# Patient Record
Sex: Female | Born: 1995 | Race: White | Hispanic: No | Marital: Single | Smoking: Former smoker
Health system: Southern US, Community
[De-identification: ages and names within clinical notes are randomized; demographics above are authoritative.]

## PROBLEM LIST (undated history)

## (undated) DIAGNOSIS — I10 Essential (primary) hypertension: Secondary | ICD-10-CM

## (undated) DIAGNOSIS — F319 Bipolar disorder, unspecified: Secondary | ICD-10-CM

## (undated) DIAGNOSIS — E079 Disorder of thyroid, unspecified: Secondary | ICD-10-CM

## (undated) DIAGNOSIS — E282 Polycystic ovarian syndrome: Secondary | ICD-10-CM

## (undated) DIAGNOSIS — R569 Unspecified convulsions: Secondary | ICD-10-CM

## (undated) HISTORY — PX: TONSILLECTOMY: SUR1361

## (undated) HISTORY — DX: Polycystic ovarian syndrome: E28.2

## (undated) HISTORY — PX: WISDOM TOOTH EXTRACTION: SHX21

---
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
## 2015-01-21 ENCOUNTER — Encounter: Payer: Self-pay | Admitting: Emergency Medicine

## 2015-01-21 ENCOUNTER — Emergency Department
Admission: EM | Admit: 2015-01-21 | Discharge: 2015-01-21 | Disposition: A | Discharge status: Home / Self Care | Payer: Medicaid Other | Attending: Emergency Medicine | Admitting: Emergency Medicine

## 2015-01-21 DIAGNOSIS — Y92410 Unspecified street and highway as the place of occurrence of the external cause: Secondary | ICD-10-CM | POA: Insufficient documentation

## 2015-01-21 DIAGNOSIS — S4992XA Unspecified injury of left shoulder and upper arm, initial encounter: Secondary | ICD-10-CM | POA: Insufficient documentation

## 2015-01-21 DIAGNOSIS — I10 Essential (primary) hypertension: Secondary | ICD-10-CM | POA: Diagnosis not present

## 2015-01-21 DIAGNOSIS — S0990XA Unspecified injury of head, initial encounter: Secondary | ICD-10-CM | POA: Diagnosis present

## 2015-01-21 DIAGNOSIS — S0093XA Contusion of unspecified part of head, initial encounter: Secondary | ICD-10-CM | POA: Diagnosis not present

## 2015-01-21 DIAGNOSIS — Z79899 Other long term (current) drug therapy: Secondary | ICD-10-CM | POA: Diagnosis not present

## 2015-01-21 DIAGNOSIS — T148 Other injury of unspecified body region: Secondary | ICD-10-CM | POA: Diagnosis not present

## 2015-01-21 DIAGNOSIS — Y998 Other external cause status: Secondary | ICD-10-CM | POA: Diagnosis not present

## 2015-01-21 DIAGNOSIS — Z87891 Personal history of nicotine dependence: Secondary | ICD-10-CM | POA: Insufficient documentation

## 2015-01-21 DIAGNOSIS — Y9389 Activity, other specified: Secondary | ICD-10-CM | POA: Diagnosis not present

## 2015-01-21 DIAGNOSIS — T148XXA Other injury of unspecified body region, initial encounter: Secondary | ICD-10-CM
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
## 2015-01-22 ENCOUNTER — Emergency Department
Admission: EM | Admit: 2015-01-22 | Discharge: 2015-01-22 | Disposition: A | Discharge status: Home / Self Care | Payer: Medicaid Other | Attending: Emergency Medicine | Admitting: Emergency Medicine

## 2015-01-22 ENCOUNTER — Emergency Department: Payer: Medicaid Other

## 2015-01-22 ENCOUNTER — Encounter: Payer: Self-pay | Admitting: Emergency Medicine

## 2015-01-22 DIAGNOSIS — Z79899 Other long term (current) drug therapy: Secondary | ICD-10-CM | POA: Insufficient documentation

## 2015-01-22 DIAGNOSIS — R42 Dizziness and giddiness: Secondary | ICD-10-CM | POA: Insufficient documentation

## 2015-01-22 DIAGNOSIS — I10 Essential (primary) hypertension: Secondary | ICD-10-CM | POA: Diagnosis not present

## 2015-01-22 DIAGNOSIS — Z3202 Encounter for pregnancy test, result negative: Secondary | ICD-10-CM | POA: Insufficient documentation

## 2015-01-22 DIAGNOSIS — Z87891 Personal history of nicotine dependence: Secondary | ICD-10-CM | POA: Diagnosis not present

## 2015-01-22 DIAGNOSIS — S161XXD Strain of muscle, fascia and tendon at neck level, subsequent encounter: Secondary | ICD-10-CM | POA: Insufficient documentation

## 2015-01-22 DIAGNOSIS — S40012D Contusion of left shoulder, subsequent encounter: Secondary | ICD-10-CM | POA: Insufficient documentation

## 2015-01-22 DIAGNOSIS — S199XXD Unspecified injury of neck, subsequent encounter: Secondary | ICD-10-CM | POA: Diagnosis present

## 2015-01-22 DIAGNOSIS — T148XXA Other injury of unspecified body region, initial encounter: Secondary | ICD-10-CM

## 2015-01-22 LAB — POCT PREGNANCY, URINE: Preg Test, Ur: NEGATIVE

## 2015-01-22 MED ORDER — HYDROCODONE-ACETAMINOPHEN 5-325 MG PO TABS: 1.0000 | ORAL_TABLET | ORAL | Status: DC | PRN

## 2015-01-22 MED ORDER — CYCLOBENZAPRINE HCL 10 MG PO TABS
10.0000 mg | ORAL_TABLET | Freq: Once | ORAL | Status: AC
  Administered 2015-01-22: 10 mg via ORAL
  Filled 2015-01-22: qty 1
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
## 2015-01-22 NOTE — ED Notes (Signed)
preg test neg

## 2015-01-22 NOTE — Discharge Instructions (Signed)
Contusion °A contusion is a deep bruise. Contusions are the result of an injury that caused bleeding under the skin. The contusion may turn blue, purple, or yellow. Minor injuries will give you a painless contusion, but more severe contusions may stay painful and swollen for a few weeks.  °CAUSES  °A contusion is usually caused by a blow, trauma, or direct force to an area of the body. °SYMPTOMS  °· Swelling and redness of the injured area. °· Bruising of the injured area. °· Tenderness and soreness of the injured area. °· Pain. °DIAGNOSIS  °The diagnosis can be made by taking a history and physical exam. An X-ray, CT scan, or MRI may be needed to determine if there were any associated injuries, such as fractures. °TREATMENT  °Specific treatment will depend on what area of the body was injured. In general, the best treatment for a contusion is resting, icing, elevating, and applying cold compresses to the injured area. Over-the-counter medicines may also be recommended for pain control. Ask your caregiver what the best treatment is for your contusion. °HOME CARE INSTRUCTIONS  °· Put ice on the injured area. °¨ Put ice in a plastic bag. °¨ Place a towel between your skin and the bag. °¨ Leave the ice on for 15-20 minutes, 3-4 times a day, or as directed by your health care provider. °· Only take over-the-counter or prescription medicines for pain, discomfort, or fever as directed by your caregiver. Your caregiver may recommend avoiding anti-inflammatory medicines (aspirin, ibuprofen, and naproxen) for 48 hours because these medicines may increase bruising. °· Rest the injured area. °· If possible, elevate the injured area to reduce swelling. °SEEK IMMEDIATE MEDICAL CARE IF:  °· You have increased bruising or swelling. °· You have pain that is getting worse. °· Your swelling or pain is not relieved with medicines. °MAKE SURE YOU:  °· Understand these instructions. °· Will watch your condition. °· Will get help right  away if you are not doing well or get worse. °Document Released: 02/02/2005 Document Revised: 04/30/2013 Document Reviewed: 02/28/2011 °ExitCare® Patient Information ©2015 [employer]. This information is not intended to replace advice given to you by your health care provider. Make sure you discuss any questions you have with your health care provider. ° °
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
## 2015-04-21 DIAGNOSIS — I10 Essential (primary) hypertension: Secondary | ICD-10-CM | POA: Insufficient documentation

## 2015-08-17 ENCOUNTER — Emergency Department (HOSPITAL_COMMUNITY): Payer: Medicaid Other

## 2015-08-17 ENCOUNTER — Encounter (HOSPITAL_COMMUNITY): Payer: Self-pay | Admitting: *Deleted

## 2015-08-17 ENCOUNTER — Emergency Department (HOSPITAL_COMMUNITY)
Admission: EM | Admit: 2015-08-17 | Discharge: 2015-08-17 | Disposition: A | Discharge status: Home / Self Care | Payer: Medicaid Other | Attending: Emergency Medicine | Admitting: Emergency Medicine

## 2015-08-17 DIAGNOSIS — S5011XA Contusion of right forearm, initial encounter: Secondary | ICD-10-CM | POA: Insufficient documentation

## 2015-08-17 DIAGNOSIS — M545 Low back pain: Secondary | ICD-10-CM

## 2015-08-17 DIAGNOSIS — S199XXA Unspecified injury of neck, initial encounter: Secondary | ICD-10-CM | POA: Diagnosis not present

## 2015-08-17 DIAGNOSIS — Z87891 Personal history of nicotine dependence: Secondary | ICD-10-CM | POA: Insufficient documentation

## 2015-08-17 DIAGNOSIS — Z8639 Personal history of other endocrine, nutritional and metabolic disease: Secondary | ICD-10-CM | POA: Insufficient documentation

## 2015-08-17 DIAGNOSIS — F319 Bipolar disorder, unspecified: Secondary | ICD-10-CM | POA: Diagnosis not present

## 2015-08-17 DIAGNOSIS — Y998 Other external cause status: Secondary | ICD-10-CM | POA: Insufficient documentation

## 2015-08-17 DIAGNOSIS — Y92410 Unspecified street and highway as the place of occurrence of the external cause: Secondary | ICD-10-CM | POA: Insufficient documentation

## 2015-08-17 DIAGNOSIS — Z79899 Other long term (current) drug therapy: Secondary | ICD-10-CM | POA: Diagnosis not present

## 2015-08-17 DIAGNOSIS — I10 Essential (primary) hypertension: Secondary | ICD-10-CM | POA: Insufficient documentation

## 2015-08-17 DIAGNOSIS — S20212A Contusion of left front wall of thorax, initial encounter: Secondary | ICD-10-CM

## 2015-08-17 DIAGNOSIS — Y9389 Activity, other specified: Secondary | ICD-10-CM | POA: Insufficient documentation

## 2015-08-17 DIAGNOSIS — S3992XA Unspecified injury of lower back, initial encounter: Secondary | ICD-10-CM | POA: Diagnosis not present

## 2015-08-17 DIAGNOSIS — S59911A Unspecified injury of right forearm, initial encounter: Secondary | ICD-10-CM | POA: Diagnosis present

## 2015-08-17 MED ORDER — METHOCARBAMOL 500 MG PO TABS: 500.0000 mg | ORAL_TABLET | Freq: Two times a day (BID) | ORAL | Status: DC

## 2015-08-17 MED ORDER — IBUPROFEN 600 MG PO TABS: 600.0000 mg | ORAL_TABLET | Freq: Four times a day (QID) | ORAL | Status: DC | PRN

## 2015-08-17 MED ORDER — IBUPROFEN 400 MG PO TABS
800.0000 mg | ORAL_TABLET | Freq: Once | ORAL | Status: AC
  Administered 2015-08-17: 800 mg via ORAL
  Filled 2015-08-17: qty 2
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
## 2015-08-17 NOTE — ED Notes (Signed)
Patient able to ambulate independently  
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
## 2016-05-13 ENCOUNTER — Emergency Department: Payer: Medicaid Other

## 2016-05-13 ENCOUNTER — Observation Stay: Payer: Medicaid Other

## 2016-05-13 ENCOUNTER — Observation Stay
Admission: EM | Admit: 2016-05-13 | Discharge: 2016-05-14 | Disposition: A | Discharge status: Home / Self Care | Payer: Medicaid Other | Attending: Internal Medicine | Admitting: Internal Medicine

## 2016-05-13 DIAGNOSIS — R112 Nausea with vomiting, unspecified: Secondary | ICD-10-CM | POA: Insufficient documentation

## 2016-05-13 DIAGNOSIS — R55 Syncope and collapse: Secondary | ICD-10-CM | POA: Diagnosis not present

## 2016-05-13 DIAGNOSIS — J189 Pneumonia, unspecified organism: Secondary | ICD-10-CM

## 2016-05-13 DIAGNOSIS — F17210 Nicotine dependence, cigarettes, uncomplicated: Secondary | ICD-10-CM | POA: Diagnosis not present

## 2016-05-13 DIAGNOSIS — I10 Essential (primary) hypertension: Secondary | ICD-10-CM | POA: Diagnosis not present

## 2016-05-13 DIAGNOSIS — G40109 Localization-related (focal) (partial) symptomatic epilepsy and epileptic syndromes with simple partial seizures, not intractable, without status epilepticus: Secondary | ICD-10-CM | POA: Insufficient documentation

## 2016-05-13 DIAGNOSIS — F319 Bipolar disorder, unspecified: Secondary | ICD-10-CM | POA: Insufficient documentation

## 2016-05-13 DIAGNOSIS — T426X1A Poisoning by other antiepileptic and sedative-hypnotic drugs, accidental (unintentional), initial encounter: Secondary | ICD-10-CM | POA: Diagnosis present

## 2016-05-13 DIAGNOSIS — R197 Diarrhea, unspecified: Secondary | ICD-10-CM

## 2016-05-13 DIAGNOSIS — Z888 Allergy status to other drugs, medicaments and biological substances status: Secondary | ICD-10-CM | POA: Insufficient documentation

## 2016-05-13 DIAGNOSIS — E860 Dehydration: Secondary | ICD-10-CM | POA: Diagnosis not present

## 2016-05-13 DIAGNOSIS — E039 Hypothyroidism, unspecified: Secondary | ICD-10-CM | POA: Insufficient documentation

## 2016-05-13 DIAGNOSIS — T426X5A Adverse effect of other antiepileptic and sedative-hypnotic drugs, initial encounter: Secondary | ICD-10-CM | POA: Diagnosis not present

## 2016-05-13 LAB — LIPASE, BLOOD: LIPASE: 32 U/L (ref 11–51)

## 2016-05-13 LAB — CBC
HEMATOCRIT: 46.7 % (ref 35.0–47.0)
HEMOGLOBIN: 16.2 g/dL — AB (ref 12.0–16.0)
MCH: 31.3 pg (ref 26.0–34.0)
MCHC: 34.8 g/dL (ref 32.0–36.0)
MCV: 89.9 fL (ref 80.0–100.0)
Platelets: 333 10*3/uL (ref 150–440)
RBC: 5.2 MIL/uL (ref 3.80–5.20)
RDW: 12.8 % (ref 11.5–14.5)
WBC: 16.1 10*3/uL — ABNORMAL HIGH (ref 3.6–11.0)

## 2016-05-13 LAB — URINE DRUG SCREEN, QUALITATIVE (ARMC ONLY)
AMPHETAMINES, UR SCREEN: NOT DETECTED
BENZODIAZEPINE, UR SCRN: NOT DETECTED
Barbiturates, Ur Screen: NOT DETECTED
Cannabinoid 50 Ng, Ur ~~LOC~~: NOT DETECTED
Cocaine Metabolite,Ur ~~LOC~~: NOT DETECTED
MDMA (Ecstasy)Ur Screen: NOT DETECTED
METHADONE SCREEN, URINE: NOT DETECTED
Opiate, Ur Screen: POSITIVE — AB
Phencyclidine (PCP) Ur S: NOT DETECTED
Tricyclic, Ur Screen: NOT DETECTED

## 2016-05-13 LAB — ETHANOL

## 2016-05-13 LAB — VALPROIC ACID LEVEL

## 2016-05-13 LAB — COMPREHENSIVE METABOLIC PANEL
ALK PHOS: 65 U/L (ref 38–126)
ALT: 23 U/L (ref 14–54)
AST: 34 U/L (ref 15–41)
Albumin: 4.3 g/dL (ref 3.5–5.0)
Anion gap: 11 (ref 5–15)
BILIRUBIN TOTAL: 0.4 mg/dL (ref 0.3–1.2)
BUN: 10 mg/dL (ref 6–20)
CALCIUM: 9.2 mg/dL (ref 8.9–10.3)
CO2: 21 mmol/L — ABNORMAL LOW (ref 22–32)
Chloride: 110 mmol/L (ref 101–111)
Creatinine, Ser: 0.95 mg/dL (ref 0.44–1.00)
GFR calc Af Amer: >60 mL/min (ref >60)
GLUCOSE: 109 mg/dL — AB (ref 65–99)
POTASSIUM: 3.6 mmol/L (ref 3.5–5.1)
Sodium: 142 mmol/L (ref 135–145)
TOTAL PROTEIN: 7.1 g/dL (ref 6.5–8.1)

## 2016-05-13 LAB — URINALYSIS, COMPLETE (UACMP) WITH MICROSCOPIC
BILIRUBIN URINE: NEGATIVE
Glucose, UA: NEGATIVE mg/dL
Hgb urine dipstick: NEGATIVE
Ketones, ur: 5 mg/dL — AB
LEUKOCYTES UA: NEGATIVE
Nitrite: NEGATIVE
Protein, ur: NEGATIVE mg/dL
SPECIFIC GRAVITY, URINE: 1.01 (ref 1.005–1.030)
pH: 6 (ref 5.0–8.0)

## 2016-05-13 LAB — POCT PREGNANCY, URINE: Preg Test, Ur: NEGATIVE

## 2016-05-13 LAB — AMMONIA: AMMONIA: 31 umol/L (ref 9–35)

## 2016-05-13 MED ORDER — ACETAMINOPHEN 650 MG RE SUPP: 650.0000 mg | Freq: Four times a day (QID) | RECTAL | Status: DC | PRN

## 2016-05-13 MED ORDER — LISINOPRIL 10 MG PO TABS
10.0000 mg | ORAL_TABLET | Freq: Every day | ORAL | Status: DC
  Administered 2016-05-13 – 2016-05-14 (×2): 10 mg via ORAL
  Filled 2016-05-13 (×2): qty 1

## 2016-05-13 MED ORDER — SODIUM CHLORIDE 0.9 % IV SOLN
INTRAVENOUS | Status: DC
  Administered 2016-05-13 (×2): via INTRAVENOUS
  Administered 2016-05-13: 09:00:00 500 mL via INTRAVENOUS
  Administered 2016-05-14: 05:00:00 via INTRAVENOUS

## 2016-05-13 MED ORDER — ACETAMINOPHEN 325 MG PO TABS: 650.0000 mg | ORAL_TABLET | Freq: Four times a day (QID) | ORAL | Status: DC | PRN

## 2016-05-13 MED ORDER — ONDANSETRON 8 MG PO TBDP
ORAL_TABLET | ORAL | Status: AC
  Administered 2016-05-13: 8 mg
  Filled 2016-05-13: qty 1

## 2016-05-13 MED ORDER — SERTRALINE HCL 50 MG PO TABS
25.0000 mg | ORAL_TABLET | Freq: Every day | ORAL | Status: DC
  Administered 2016-05-13 – 2016-05-14 (×2): 25 mg via ORAL
  Filled 2016-05-13 (×2): qty 1

## 2016-05-13 MED ORDER — SODIUM CHLORIDE 0.9 % IV BOLUS (SEPSIS)
1000.0000 mL | INTRAVENOUS | Status: AC
  Administered 2016-05-13: 1000 mL via INTRAVENOUS

## 2016-05-13 MED ORDER — TRAZODONE HCL 50 MG PO TABS: 150.0000 mg | ORAL_TABLET | Freq: Every day | ORAL | Status: DC

## 2016-05-13 MED ORDER — KETOROLAC TROMETHAMINE 30 MG/ML IJ SOLN
30.0000 mg | Freq: Three times a day (TID) | INTRAMUSCULAR | Status: DC | PRN
  Administered 2016-05-13: 30 mg via INTRAVENOUS
  Filled 2016-05-13: qty 1

## 2016-05-13 MED ORDER — ONDANSETRON HCL 4 MG/2ML IJ SOLN: 4.0000 mg | Freq: Four times a day (QID) | INTRAMUSCULAR | Status: DC | PRN

## 2016-05-13 MED ORDER — SENNOSIDES-DOCUSATE SODIUM 8.6-50 MG PO TABS: 1.0000 | ORAL_TABLET | Freq: Every evening | ORAL | Status: DC | PRN

## 2016-05-13 MED ORDER — ONDANSETRON HCL 4 MG PO TABS
4.0000 mg | ORAL_TABLET | Freq: Four times a day (QID) | ORAL | Status: DC | PRN
  Administered 2016-05-13: 4 mg via ORAL
  Filled 2016-05-13: qty 1

## 2016-05-13 MED ORDER — IBUPROFEN 600 MG PO TABS: 600.0000 mg | ORAL_TABLET | Freq: Four times a day (QID) | ORAL | Status: DC | PRN

## 2016-05-13 MED ORDER — LAMOTRIGINE 100 MG PO TABS
100.0000 mg | ORAL_TABLET | Freq: Every day | ORAL | Status: DC
  Administered 2016-05-13: 23:00:00 100 mg via ORAL
  Filled 2016-05-13 (×2): qty 1

## 2016-05-13 NOTE — ED Notes (Signed)
Pt unable to urinate at this time. Pt states she feels very dry. Aware we need urine sample.Cup of water given to drink.

## 2016-05-13 NOTE — ED Provider Notes (Signed)
[facility] Emergency Department Provider Note  ____________________________________________   None    (approximate)  I have reviewed the triage vital signs and the nursing notes.   HISTORY  Chief Complaint Loss of Consciousness    HPI Alexandra Solis is a 21 y.o. female His past medical history includes seizure disorder and she takes Lamictal and Depakote.  She presents for evaluation of general malaise and recurrent episodes of vomiting, diarrhea, and intermittent abdominal pain.  She also states that she has had 6 episodes today of passing out.  She feels weak all over all she has no focal numbness, weakness, or tingling in history of her extremities.  She does not have a headache and has had no visual changes.  She denies changing her medication regimen or adding any new medications recently.  She denies chest pain and shortness of breath.  She describes her symptoms overall as severe.  She had a large episode of emesis while awaiting in exam room.   Past Medical History:  Diagnosis Date  . Bipolar 1 disorder (HCC)   . Bipolar 1 disorder (HCC)   . Hypertension   . Seizures (HCC)   . Thyroid disease    hypo    There are no active problems to display for this patient.   Past Surgical History:  Procedure Laterality Date  . TONSILLECTOMY      Prior to Admission medications   Medication Sig Start Date End Date Taking? Authorizing Provider  HYDROcodone-acetaminophen (NORCO/VICODIN) 5-325 MG per tablet Take 1 tablet by mouth every 4 (four) hours as needed. 01/22/15   Chinita Pesterari B Triplett, FNP  ibuprofen (ADVIL,MOTRIN) 600 MG tablet Take 1 tablet (600 mg total) by mouth every 6 (six) hours as needed. 08/17/15   Elson AreasLeslie K Sofia, PA-C  lamoTRIgine (LAMICTAL) 100 MG tablet Take 100 mg by mouth at bedtime.    Historical Provider, MD  lisinopril (PRINIVIL,ZESTRIL) 10 MG tablet Take 10 mg by mouth daily.    Historical Provider, MD  LORazepam (ATIVAN) 0.5 MG  tablet Take 0.5 mg by mouth every 8 (eight) hours.    Historical Provider, MD  methocarbamol (ROBAXIN) 500 MG tablet Take 1 tablet (500 mg total) by mouth 2 (two) times daily. 08/17/15   Elson AreasLeslie K Sofia, PA-C  sertraline (ZOLOFT) 25 MG tablet Take 25 mg by mouth daily with breakfast.     Historical Provider, MD  traZODone (DESYREL) 150 MG tablet Take 150 mg by mouth at bedtime.    Historical Provider, MD  zolpidem (AMBIEN) 10 MG tablet Take 10 mg by mouth at bedtime as needed for sleep.    Historical Provider, MD    Allergies Seroquel [quetiapine fumarate]  No family history on file.  Social History Social History  Substance Use Topics  . Smoking status: Former Games developermoker  . Smokeless tobacco: Not on file  . Alcohol use Yes    Review of Systems Constitutional: No fever/chills.  General malaise, generalized weakness. Eyes: No visual changes. ENT: No sore throat. Cardiovascular: Denies chest pain.  Syncope x 6 episodes today Respiratory: Denies shortness of breath. Gastrointestinal: N/V/D x 12+ hours.  Intermittent abd pain, now resolved. Genitourinary: Negative for dysuria. Musculoskeletal: Negative for back pain. Skin: Negative for rash. Neurological: Negative for headaches, focal weakness or numbness.  10-point ROS otherwise negative.  ____________________________________________   PHYSICAL EXAM:  VITAL SIGNS: ED Triage Vitals  Enc Vitals Group     BP 05/13/16 0011 132/75     Pulse Rate 05/13/16  0011 (!) 117     Resp 05/13/16 0011 20     Temp 05/13/16 0011 98.1 F (36.7 C)     Temp Source 05/13/16 0011 Oral     SpO2 05/13/16 0011 99 %     Weight 05/13/16 0012 230 lb (104.3 kg)     Height 05/13/16 0012 5\' 11"  (1.803 m)     Head Circumference --      Peak Flow --      Pain Score 05/13/16 0014 10     Pain Loc --      Pain Edu? --      Excl. in GC? --     Constitutional: Alert and oriented. Well appearing and in no acute distress. Eyes: Conjunctivae are normal.  PERRL. EOMI. Head: Atraumatic. Nose: No congestion/rhinnorhea. Mouth/Throat: Mucous membranes are dry, lips are cracked.  Oropharynx non-erythematous. Neck: No stridor.  No meningeal signs.   Cardiovascular: Normal rate, regular rhythm. Good peripheral circulation. Grossly normal heart sounds. Respiratory: Normal respiratory effort.  No retractions. Lungs CTAB. Gastrointestinal: Soft and nontender. No distention.  Musculoskeletal: No lower extremity tenderness nor edema. No gross deformities of extremities. Neurologic:  Normal speech and language. No gross focal neurologic deficits are appreciated.  Skin:  Skin is warm, dry and intact. No rash noted. Psychiatric: Mood and affect are normal. Speech and behavior are normal.  ____________________________________________   LABS (all labs ordered are listed, but only abnormal results are displayed)  Labs Reviewed  CBC - Abnormal; Notable for the following:       Result Value   WBC 16.1 (*)    Hemoglobin 16.2 (*)    All other components within normal limits  COMPREHENSIVE METABOLIC PANEL - Abnormal; Notable for the following:    CO2 21 (*)    Glucose, Bld 109 (*)    All other components within normal limits  VALPROIC ACID LEVEL - Abnormal; Notable for the following:    Valproic Acid Lvl >150 (*)    All other components within normal limits  LIPASE, BLOOD  AMMONIA  ETHANOL  URINALYSIS, COMPLETE (UACMP) WITH MICROSCOPIC  URINE DRUG SCREEN, QUALITATIVE ([facility] ONLY)  POC URINE PREG, ED   ____________________________________________  EKG  ED ECG REPORT I, Mont Jagoda, the attending physician, personally viewed and interpreted this ECG.  Date: 05/13/2016 EKG Time: 6:15 AM Rate: 98 Rhythm: normal sinus rhythm QRS Axis: normal Intervals: normal, QTC 484 ms ST/T Wave abnormalities: normal Conduction Disturbances: none Narrative Interpretation: unremarkable  ____________________________________________  RADIOLOGY   Ct  Head Wo Contrast  Result Date: 05/13/2016 CLINICAL DATA:  Syncopal episode tonight. EXAM: CT HEAD WITHOUT CONTRAST TECHNIQUE: Contiguous axial images were obtained from the base of the skull through the vertex without intravenous contrast. COMPARISON:  01/22/2015 FINDINGS: Brain: There is no intracranial hemorrhage, mass or evidence of acute infarction. There is no extra-axial fluid collection. Gray matter and white matter appear normal. Cerebral volume is normal for age. Brainstem and posterior fossa are unremarkable. The CSF spaces appear normal. Prominent perivascular space in the inferior right basal ganglia, unchanged. Vascular: No hyperdense vessel or unexpected calcification. Skull: Normal. Negative for fracture or focal lesion. Sinuses/Orbits: No acute finding. Other: None. IMPRESSION: Normal brain Electronically Signed   By: Ellery Plunk M.D.   On: 05/13/2016 05:45    ____________________________________________   PROCEDURES  Procedure(s) performed:   Procedures   Critical Care performed: No ____________________________________________   INITIAL IMPRESSION / ASSESSMENT AND PLAN / ED COURSE  Pertinent labs & imaging results  that were available during my care of the patient were reviewed by me and considered in my medical decision making (see chart for details).   Clinical Course as of May 13 621  Caleen Essex May 13, 2016  4098 We brought the patient back as soon as the lab informed us of the critical valproic acid level.  Ordering ammonia level and CT head non-con (to eval for possible cerebral edema).  Giving 1L NS, adding on urine drug screen.  Anticipate admission for symptom management and observation while monitoring depakote levels.  [CF]  0612 Normal ammonia and normal head CT which is reassuring.  Will admit to hospitalist for further management of valproic acid overdose w/ N/V/D and syncope. Ammonia: 31 [CF]    Clinical Course User Index [CF] Loleta Rose, MD     ____________________________________________  FINAL CLINICAL IMPRESSION(S) / ED DIAGNOSES  Final diagnoses:  Valproic acid toxicity, accidental or unintentional, initial encounter  Nausea vomiting and diarrhea  Syncope, unspecified syncope type     MEDICATIONS GIVEN DURING THIS VISIT:  Medications  ondansetron (ZOFRAN-ODT) 8 MG disintegrating tablet (8 mg  Given 05/13/16 0228)  sodium chloride 0.9 % bolus 1,000 mL (1,000 mLs Intravenous New Bag/Given 05/13/16 0417)     NEW OUTPATIENT MEDICATIONS STARTED DURING THIS VISIT:  New Prescriptions   No medications on file    Modified Medications   No medications on file    Discontinued Medications   No medications on file     Note:  This document was prepared using Dragon voice recognition software and may include unintentional dictation errors.    Loleta Rose, MD 05/13/16 [phone]

## 2016-05-13 NOTE — ED Notes (Signed)
Patient transported to CT 

## 2016-05-13 NOTE — H&P (Signed)
[facility] [place] at [facility]   PATIENT NAME: Alexandra Solis    MR#:  [id number]  DATE OF BIRTH:  07/13/1995  DATE OF ADMISSION:  05/13/2016  PRIMARY CARE PHYSICIAN: Pcp Not In System   REQUESTING/REFERRING PHYSICIAN:   CHIEF COMPLAINT:   Chief Complaint  Patient presents with  . Loss of Consciousness    HISTORY OF PRESENT ILLNESS: Alexandra Solis  is a 21 y.o. female with a known history of Bipolar disorder, hypertension, seizure disorder presented to the emergency room for nausea or vomiting and weakness. This has been going on for the last couple of days. Patient also has multiple episodes of passing out since one day. She has been recently started on Depakote for seizure disorder. The levels of Depakote were checked in the emergency room and were elevated. No complaints of any chest pain, shortness of breath. Patient is awake and alert and responds to all verbal commands in the emergency room. Complaints of dizziness and generalized weakness. Patient also has not been able to drink any fluids because of the vomiting. She appears dry and dehydrated.  PAST MEDICAL HISTORY:   Past Medical History:  Diagnosis Date  . Bipolar 1 disorder (HCC)   . Bipolar 1 disorder (HCC)   . Hypertension   . Seizures (HCC)   . Thyroid disease    hypo    PAST SURGICAL HISTORY: Past Surgical History:  Procedure Laterality Date  . TONSILLECTOMY      SOCIAL HISTORY:  Social History  Substance Use Topics  . Smoking status: Current Some Day Smoker    Types: Cigarettes  . Smokeless tobacco: Never Used  . Alcohol use Yes    FAMILY HISTORY: No family history on file.  DRUG ALLERGIES:  Allergies  Allergen Reactions  . Seroquel [Quetiapine Fumarate] Other (See Comments)    Behavorial changes    REVIEW OF SYSTEMS:   CONSTITUTIONAL: No fever, has weakness.  EYES: No blurred or double vision.  EARS, NOSE, AND THROAT: No tinnitus or ear pain.  RESPIRATORY: No  cough, shortness of breath, wheezing or hemoptysis.  CARDIOVASCULAR: No chest pain, orthopnea, edema.  GASTROINTESTINAL: Has nausea, vomiting,  No diarrhea or abdominal pain.  GENITOURINARY: No dysuria, hematuria.  ENDOCRINE: No polyuria, nocturia,  HEMATOLOGY: No anemia, easy bruising or bleeding SKIN: No rash or lesion. MUSCULOSKELETAL: No joint pain or arthritis.   NEUROLOGIC: No tingling, numbness, weakness.  Passed out multiple times PSYCHIATRY: No anxiety or depression.   MEDICATIONS AT HOME:  Prior to Admission medications   Medication Sig Start Date End Date Taking? Authorizing Provider  HYDROcodone-acetaminophen (NORCO/VICODIN) 5-325 MG per tablet Take 1 tablet by mouth every 4 (four) hours as needed. 01/22/15   Chinita Pester, FNP  ibuprofen (ADVIL,MOTRIN) 600 MG tablet Take 1 tablet (600 mg total) by mouth every 6 (six) hours as needed. 08/17/15   Elson Areas, PA-C  lamoTRIgine (LAMICTAL) 100 MG tablet Take 100 mg by mouth at bedtime.    Historical Provider, MD  lisinopril (PRINIVIL,ZESTRIL) 10 MG tablet Take 10 mg by mouth daily.    Historical Provider, MD  LORazepam (ATIVAN) 0.5 MG tablet Take 0.5 mg by mouth every 8 (eight) hours.    Historical Provider, MD  methocarbamol (ROBAXIN) 500 MG tablet Take 1 tablet (500 mg total) by mouth 2 (two) times daily. 08/17/15   Elson Areas, PA-C  sertraline (ZOLOFT) 25 MG tablet Take 25 mg by mouth daily with breakfast.     Historical  Provider, MD  traZODone (DESYREL) 150 MG tablet Take 150 mg by mouth at bedtime.    Historical Provider, MD  zolpidem (AMBIEN) 10 MG tablet Take 10 mg by mouth at bedtime as needed for sleep.    Historical Provider, MD      PHYSICAL EXAMINATION:   VITAL SIGNS: Blood pressure 95/74, pulse 89, temperature 98.1 F (36.7 C), temperature source Oral, resp. rate 17, height 5\' 11"  (1.803 m), weight 104.3 kg (230 lb), last menstrual period 04/12/2016, SpO2 99 %.  GENERAL:  21 y.o.-year-old patient lying in  the bed with no acute distress.  EYES: Pupils equal, round, reactive to light and accommodation. No scleral icterus. Extraocular muscles intact.  HEENT: Head atraumatic, normocephalic. Oropharynx dry and nasopharynx clear.  NECK:  Supple, no jugular venous distention. No thyroid enlargement, no tenderness.  LUNGS: Normal breath sounds bilaterally, no wheezing, rales,rhonchi or crepitation. No use of accessory muscles of respiration.  CARDIOVASCULAR: S1, S2 normal. No murmurs, rubs, or gallops.  ABDOMEN: Soft, nontender, nondistended. Bowel sounds present. No organomegaly or mass.  EXTREMITIES: No pedal edema, cyanosis, or clubbing.  NEUROLOGIC: Cranial nerves II through XII are intact. Muscle strength 5/5 in all extremities. Sensation intact. Gait not checked.  PSYCHIATRIC: The patient is alert and oriented x 3.  SKIN: No obvious rash, lesion, or ulcer.   LABORATORY PANEL:   CBC  Recent Labs Lab 05/13/16 0017  WBC 16.1*  HGB 16.2*  HCT 46.7  PLT 333  MCV 89.9  MCH 31.3  MCHC 34.8  RDW 12.8   ------------------------------------------------------------------------------------------------------------------  Chemistries   Recent Labs Lab 05/13/16 0017  NA 142  K 3.6  CL 110  CO2 21*  GLUCOSE 109*  BUN 10  CREATININE 0.95  CALCIUM 9.2  AST 34  ALT 23  ALKPHOS 65  BILITOT 0.4   ------------------------------------------------------------------------------------------------------------------ estimated creatinine clearance is 125.6 mL/min (by C-G formula based on SCr of 0.95 mg/dL). ------------------------------------------------------------------------------------------------------------------ No results for input(s): TSH, T4TOTAL, T3FREE, THYROIDAB in the last 72 hours.  Invalid input(s): FREET3   Coagulation profile No results for input(s): INR, PROTIME in the last 168  hours. ------------------------------------------------------------------------------------------------------------------- No results for input(s): DDIMER in the last 72 hours. -------------------------------------------------------------------------------------------------------------------  Cardiac Enzymes No results for input(s): CKMB, TROPONINI, MYOGLOBIN in the last 168 hours.  Invalid input(s): CK ------------------------------------------------------------------------------------------------------------------ Invalid input(s): POCBNP  ---------------------------------------------------------------------------------------------------------------  Urinalysis    Component Value Date/Time   COLORURINE YELLOW 05/18/2014 1441   APPEARANCEUR CLEAR 05/18/2014 1441   APPEARANCEUR Hazy 05/13/2014 2232   LABSPEC 1.006 05/18/2014 1441   LABSPEC 1.025 05/13/2014 2232   PHURINE 6.5 05/18/2014 1441   GLUCOSEU NEGATIVE 05/18/2014 1441   GLUCOSEU Negative 05/13/2014 2232   HGBUR TRACE (A) 05/18/2014 1441   BILIRUBINUR NEGATIVE 05/18/2014 1441   BILIRUBINUR 2+ 05/13/2014 2232   KETONESUR NEGATIVE 05/18/2014 1441   PROTEINUR NEGATIVE 05/18/2014 1441   UROBILINOGEN 0.2 05/18/2014 1441   NITRITE NEGATIVE 05/18/2014 1441   LEUKOCYTESUR NEGATIVE 05/18/2014 1441   LEUKOCYTESUR Negative 05/13/2014 2232     RADIOLOGY: Ct Head Wo Contrast  Result Date: 05/13/2016 CLINICAL DATA:  Syncopal episode tonight. EXAM: CT HEAD WITHOUT CONTRAST TECHNIQUE: Contiguous axial images were obtained from the base of the skull through the vertex without intravenous contrast. COMPARISON:  01/22/2015 FINDINGS: Brain: There is no intracranial hemorrhage, mass or evidence of acute infarction. There is no extra-axial fluid collection. Gray matter and white matter appear normal. Cerebral volume is normal for age. Brainstem and posterior fossa are unremarkable. The CSF spaces appear  normal. Prominent perivascular space in  the inferior right basal ganglia, unchanged. Vascular: No hyperdense vessel or unexpected calcification. Skull: Normal. Negative for fracture or focal lesion. Sinuses/Orbits: No acute finding. Other: None. IMPRESSION: Normal brain Electronically Signed   By: Ellery Plunkaniel R Mitchell M.D.   On: 05/13/2016 05:45    EKG: Orders placed or performed during the hospital encounter of 05/13/16  . ED EKG  . ED EKG  . EKG 12-Lead  . EKG 12-Lead  . EKG 12-Lead  . EKG 12-Lead    IMPRESSION AND PLAN: 21 year old female patient with history of bipolar disorder, seizure disorder presented to the emergency room with nausea, vomiting and passing out multiple times. Admitting diagnosis 1. Dehydration 2. Intractable nausea vomiting 3. Depakote toxicity 4. Seizure disorder Treatment plan Admit patient to medical floor observation bed IV fluid hydration Hold Depakote for now Recheck Depakote level in the morning Antiemetics for nausea and vomiting Check chest x-ray to rule out any pneumonia Leukocytosis could be secondary to dehydration Follow-up WBC count Supportive care. All the records are reviewed and case discussed with ED provider. Management plans discussed with the patient, family and they are in agreement.  CODE STATUS:FULL CODE Code Status History    Date Active Date Inactive Code Status Order ID Comments User Context   10/24/2014  8:30 PM 10/25/2014  2:54 AM Full Code [id number]  Toy CookeyMegan Docherty, MD ED       TOTAL TIME TAKING CARE OF THIS PATIENT: 50 minutes.    Ihor AustinPavan Pyreddy M.D on 05/13/2016 at 6:42 AM  Between 7am to 6pm - Pager - [phone]  After 6pm go to [email or web address] - password [email or web address]  [facility] [place] [facility]  Office  [phone]  CC: Primary care physician; Pcp Not In System

## 2016-05-13 NOTE — ED Notes (Signed)
Patient states she is still unable to void, she said there is "no way I am pregnant".. I told MD and CT and she will sign waiver and proceed with CT when they are able to get her.

## 2016-05-13 NOTE — ED Triage Notes (Addendum)
Pt in with co dizziness and syncope today. States has had 6 syncopal episodes states is on lamictal for seizures. Denies any recent illness, last seizure was a month ago. Pt here for right sided abd pain n.v.d since today.
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
## 2016-05-13 NOTE — ED Notes (Signed)
Pt vomited large amt in waiting room, med given per protocol.

## 2016-05-13 NOTE — Progress Notes (Signed)
Nutrition Brief Note  Patient identified on the Malnutrition Screening Tool (MST) Report  Wt Readings from Last 15 Encounters:  05/13/16 230 lb (104.3 kg)  01/22/15 220 lb (99.8 kg) (99 %, Z= 2.20)*  01/21/15 220 lb (99.8 kg) (99 %, Z= 2.20)*  10/14/14 229 lb (103.9 kg) (99 %, Z= 2.29)*  05/04/14 220 lb (99.8 kg) (99 %, Z= 2.20)*   * Growth percentiles are based on CDC 2-20 Years data.    Body mass index is 32.08 kg/m. Patient meets criteria for Obesity Unspecified based on current BMI.  Weight appears stable over the last several years, no significant wt loss  Current diet order is Regular, Labs and medications reviewed.   No nutrition interventions warranted at this time. If nutrition issues arise, please consult RD.   Romelle Starcherate Alexus Galka MS, RD, LDN [phone] Pager  [phone] Weekend/On-Call Pager

## 2016-05-13 NOTE — ED Notes (Signed)
Told patient we need urine and put a hat in the toilet, she says she is unable to go at this time.  I will attempt again after the IV fluids have had a chance to work.

## 2016-05-14 LAB — BASIC METABOLIC PANEL
ANION GAP: 3 — AB (ref 5–15)
BUN: 9 mg/dL (ref 6–20)
CALCIUM: 7.8 mg/dL — AB (ref 8.9–10.3)
CO2: 25 mmol/L (ref 22–32)
CREATININE: 1.05 mg/dL — AB (ref 0.44–1.00)
Chloride: 113 mmol/L — ABNORMAL HIGH (ref 101–111)
GFR calc Af Amer: >60 mL/min (ref >60)
GLUCOSE: 81 mg/dL (ref 65–99)
Potassium: 3.9 mmol/L (ref 3.5–5.1)
Sodium: 141 mmol/L (ref 135–145)

## 2016-05-14 LAB — VALPROIC ACID LEVEL: Valproic Acid Lvl: 19 ug/mL — ABNORMAL LOW (ref 50.0–100.0)

## 2016-05-14 MED ORDER — LEVETIRACETAM 500 MG PO TABS
500.0000 mg | ORAL_TABLET | Freq: Two times a day (BID) | ORAL | Status: DC
  Administered 2016-05-14: 500 mg via ORAL
  Filled 2016-05-14 (×2): qty 1

## 2016-05-14 MED ORDER — LEVETIRACETAM 500 MG PO TABS: 500.0000 mg | ORAL_TABLET | Freq: Two times a day (BID) | ORAL | 0 refills | Status: DC

## 2016-05-14 NOTE — Discharge Instructions (Addendum)
Depakote Poisoning.  Stop depakote. With history of seizures, no drinking alcohol, no drugs other than what is prescribed. Follow up with Dr Malvin JohnsPotter as outpatient.  Start Keprra 500mg  po twice a day

## 2016-05-14 NOTE — Discharge Summary (Signed)
[facility] - [place] at [facility]   PATIENT NAME: Alexandra Solis    MR#:  [id number]  DATE OF BIRTH:  Nov 24, 1995  DATE OF ADMISSION:  05/13/2016 ADMITTING PHYSICIAN: Houston SirenVivek J Sainani, MD  DATE OF DISCHARGE: 05/14/2016 10:18 AM  PRIMARY CARE PHYSICIAN: Dr Evelene CroonMeindert Niemeyer   ADMISSION DIAGNOSIS:  Pneumonia [J18.9] Nausea vomiting and diarrhea [R11.2, R19.7] Valproic acid toxicity, accidental or unintentional, initial encounter [T42.6X1A] Syncope, unspecified syncope type [R55]  DISCHARGE DIAGNOSIS:  Active Problems:   Valproic acid toxicity   SECONDARY DIAGNOSIS:   Past Medical History:  Diagnosis Date  . Bipolar 1 disorder (HCC)   . Bipolar 1 disorder (HCC)   . Hypertension   . Seizures (HCC)   . Thyroid disease    hypo    HOSPITAL COURSE:   1. Depakote toxicity. Patient was admitted with syncope, nausea vomiting and weakness. She was found to have a high Depakote level was given IV fluid hydration. The patient states that she started on Depakote for temporal lobe focal seizures. She does not want to go back on this medication. She states that a friend may have port her bottle of Depakote in her drink. No thoughts of hurting herself or other people. Case discussed with Dr. Loretha BrasilZeylikman neurology and he recommended starting Keppra 500 mg twice a day and follow-up with Dr. Malvin JohnsPotter as outpatient. 2. Bipolar disorder. No suicidal or homicidal ideation. Continue Zyprexa 3. History of temporal lobe focal seizures. Start Keppra and continue Lamictal. Follow-up with Dr. Malvin JohnsPotter neurology. Patient was advised to take medications as prescribed and to keep her medications away from other people. She was advised not to drink alcohol and not to use any other drugs besides ones that are prescribed. 4. History of hypertension stop lisinopril  DISCHARGE CONDITIONS:   Satisfactory  CONSULTS OBTAINED:  Treatment Team:  Pauletta BrownsYuriy Zeylikman, MD  DRUG ALLERGIES:   Allergies   Allergen Reactions  . Seroquel [Quetiapine Fumarate] Other (See Comments)    Behavorial changes    DISCHARGE MEDICATIONS:   Discharge Medication List as of 05/14/2016  9:37 AM    START taking these medications   Details  levETIRAcetam (KEPPRA) 500 MG tablet Take 1 tablet (500 mg total) by mouth 2 (two) times daily., Starting Sat 05/14/2016, Print      CONTINUE these medications which have NOT CHANGED   Details  lamoTRIgine (LAMICTAL) 100 MG tablet Take 100 mg by mouth at bedtime., Until Discontinued, Historical Med    OLANZapine (ZYPREXA) 20 MG tablet Take 20 mg by mouth daily., Historical Med      STOP taking these medications     ibuprofen (ADVIL,MOTRIN) 600 MG tablet      methocarbamol (ROBAXIN) 500 MG tablet      lisinopril (PRINIVIL,ZESTRIL) 10 MG tablet      sertraline (ZOLOFT) 25 MG tablet      traZODone (DESYREL) 150 MG tablet          DISCHARGE INSTRUCTIONS:   Follow-up with PMD one week Follow-up with Dr. Malvin JohnsPotter in 2 weeks  If you experience worsening of your admission symptoms, develop shortness of breath, life threatening emergency, suicidal or homicidal thoughts you must seek medical attention immediately by calling 911 or calling your MD immediately  if symptoms less severe.  You Must read complete instructions/literature along with all the possible adverse reactions/side effects for all the Medicines you take and that have been prescribed to you. Take any new Medicines after you have completely understood and accept all  the possible adverse reactions/side effects.   Please note  You were cared for by a hospitalist during your hospital stay. If you have any questions about your discharge medications or the care you received while you were in the hospital after you are discharged, you can call the unit and asked to speak with the hospitalist on call if the hospitalist that took care of you is not available. Once you are discharged, your primary care  physician will handle any further medical issues. Please note that NO REFILLS for any discharge medications will be authorized once you are discharged, as it is imperative that you return to your primary care physician (or establish a relationship with a primary care physician if you do not have one) for your aftercare needs so that they can reassess your need for medications and monitor your lab values.    Today   CHIEF COMPLAINT:   Chief Complaint  Patient presents with  . Loss of Consciousness    HISTORY OF PRESENT ILLNESS:  Alexandra Solis  is a 21 y.o. female presented with loss of consciousness   VITAL SIGNS:  Blood pressure 107/60, pulse 72, temperature 97.8 F (36.6 C), temperature source Oral, resp. rate 19, height 5\' 11"  (1.803 m), weight 104.3 kg (230 lb), last menstrual period 04/12/2016, SpO2 100 %.    PHYSICAL EXAMINATION:  GENERAL:  21 y.o.-year-old patient lying in the bed with no acute distress.  EYES: Pupils equal, round, reactive to light and accommodation. No scleral icterus. Extraocular muscles intact.  HEENT: Head atraumatic, normocephalic. Oropharynx and nasopharynx clear.  NECK:  Supple, no jugular venous distention. No thyroid enlargement, no tenderness.  LUNGS: Normal breath sounds bilaterally, no wheezing, rales,rhonchi or crepitation. No use of accessory muscles of respiration.  CARDIOVASCULAR: S1, S2 normal. No murmurs, rubs, or gallops.  ABDOMEN: Soft, non-tender, non-distended. Bowel sounds present. No organomegaly or mass.  EXTREMITIES: No pedal edema, cyanosis, or clubbing.  NEUROLOGIC: Cranial nerves II through XII are intact. Muscle strength 5/5 in all extremities. Sensation intact. Gait not checked.  PSYCHIATRIC: The patient is alert and oriented x 3.  SKIN: No obvious rash, lesion, or ulcer.   DATA REVIEW:   CBC  Recent Labs Lab 05/13/16 0017  WBC 16.1*  HGB 16.2*  HCT 46.7  PLT 333    Chemistries   Recent Labs Lab  05/13/16 0017 05/14/16 0449  NA 142 141  K 3.6 3.9  CL 110 113*  CO2 21* 25  GLUCOSE 109* 81  BUN 10 9  CREATININE 0.95 1.05*  CALCIUM 9.2 7.8*  AST 34  --   ALT 23  --   ALKPHOS 65  --   BILITOT 0.4  --      RADIOLOGY:  Dg Chest 2 View  Result Date: 05/13/2016 CLINICAL DATA:  Pneumonia EXAM: CHEST  2 VIEW COMPARISON:  08/17/2015 FINDINGS: The heart size and mediastinal contours are within normal limits. Both lungs are clear. The visualized skeletal structures are unremarkable. IMPRESSION: No active cardiopulmonary disease. Electronically Signed   By: Alcide Clever M.D.   On: 05/13/2016 07:15   Ct Head Wo Contrast  Result Date: 05/13/2016 CLINICAL DATA:  Syncopal episode tonight. EXAM: CT HEAD WITHOUT CONTRAST TECHNIQUE: Contiguous axial images were obtained from the base of the skull through the vertex without intravenous contrast. COMPARISON:  01/22/2015 FINDINGS: Brain: There is no intracranial hemorrhage, mass or evidence of acute infarction. There is no extra-axial fluid collection. Gray matter and white matter appear normal. Cerebral volume  is normal for age. Brainstem and posterior fossa are unremarkable. The CSF spaces appear normal. Prominent perivascular space in the inferior right basal ganglia, unchanged. Vascular: No hyperdense vessel or unexpected calcification. Skull: Normal. Negative for fracture or focal lesion. Sinuses/Orbits: No acute finding. Other: None. IMPRESSION: Normal brain Electronically Signed   By: Ellery Plunk M.D.   On: 05/13/2016 05:45      Management plans discussed with the patient, and she is in agreement.  CODE STATUS:  Code Status History    Date Active Date Inactive Code Status Order ID Comments User Context   05/13/2016  8:31 AM 05/14/2016  1:31 PM Full Code [id number]  Ihor Austin, MD Inpatient   10/24/2014  8:30 PM 10/25/2014  2:54 AM Full Code [id number]  Toy Cookey, MD ED      TOTAL TIME TAKING CARE OF THIS PATIENT: 35 minutes.     Alford Highland M.D on 05/14/2016 at 6:12 PM  Between 7am to 6pm - Pager - [phone]  After 6pm go to [email or web address] - password [email or web address]  [facility] Office  [phone]  CC: Primary care physician; Dr Evelene Croon

## 2016-05-14 NOTE — Progress Notes (Signed)
Patient discharged home per MD order. Prescription given to patient. All discharge instructions given and all questions answered. 

## 2016-05-14 NOTE — Progress Notes (Signed)
Patient ID: Alexandra Solis, female   DOB: 1995/06/18, 21 y.o.   MRN: [id number] [facility] - [place] at [facility]        Truman Haywardshley Muzquiz was admitted to the Hospital on 05/13/2016 and Discharged  05/14/2016 and should be excused from work/school   for 3  days starting 05/13/2016 , may return to work/school without any restrictions.  Alford HighlandWIETING, Locklan Canoy M.D on 05/14/2016,at 10:10 AM  [facility] - [place] at [facility]    Office  [phone]

## 2016-08-18 ENCOUNTER — Emergency Department: Payer: Medicaid Other

## 2016-08-18 ENCOUNTER — Emergency Department
Admission: EM | Admit: 2016-08-18 | Discharge: 2016-08-18 | Disposition: A | Discharge status: Home / Self Care | Payer: Medicaid Other | Attending: Student in an Organized Health Care Education/Training Program | Admitting: Student in an Organized Health Care Education/Training Program

## 2016-08-18 DIAGNOSIS — F17210 Nicotine dependence, cigarettes, uncomplicated: Secondary | ICD-10-CM | POA: Insufficient documentation

## 2016-08-18 DIAGNOSIS — R935 Abnormal findings on diagnostic imaging of other abdominal regions, including retroperitoneum: Secondary | ICD-10-CM | POA: Insufficient documentation

## 2016-08-18 DIAGNOSIS — R1011 Right upper quadrant pain: Secondary | ICD-10-CM | POA: Diagnosis present

## 2016-08-18 DIAGNOSIS — E039 Hypothyroidism, unspecified: Secondary | ICD-10-CM | POA: Insufficient documentation

## 2016-08-18 DIAGNOSIS — Z79899 Other long term (current) drug therapy: Secondary | ICD-10-CM | POA: Insufficient documentation

## 2016-08-18 DIAGNOSIS — I10 Essential (primary) hypertension: Secondary | ICD-10-CM | POA: Insufficient documentation

## 2016-08-18 LAB — COMPREHENSIVE METABOLIC PANEL
ALBUMIN: 4.7 g/dL (ref 3.5–5.0)
ALK PHOS: 74 U/L (ref 38–126)
ALT: 16 U/L (ref 14–54)
AST: 21 U/L (ref 15–41)
Anion gap: 8 (ref 5–15)
BUN: 8 mg/dL (ref 6–20)
CALCIUM: 9.9 mg/dL (ref 8.9–10.3)
CHLORIDE: 108 mmol/L (ref 101–111)
CO2: 23 mmol/L (ref 22–32)
CREATININE: 0.84 mg/dL (ref 0.44–1.00)
GFR calc non Af Amer: >60 mL/min (ref >60)
GLUCOSE: 104 mg/dL — AB (ref 65–99)
Potassium: 3.8 mmol/L (ref 3.5–5.1)
SODIUM: 139 mmol/L (ref 135–145)
Total Bilirubin: 0.5 mg/dL (ref 0.3–1.2)
Total Protein: 8 g/dL (ref 6.5–8.1)

## 2016-08-18 LAB — CBC
HCT: 47.1 % — ABNORMAL HIGH (ref 35.0–47.0)
Hemoglobin: 15.6 g/dL (ref 12.0–16.0)
MCH: 30.3 pg (ref 26.0–34.0)
MCHC: 33.2 g/dL (ref 32.0–36.0)
MCV: 91.4 fL (ref 80.0–100.0)
PLATELETS: 313 10*3/uL (ref 150–440)
RBC: 5.16 MIL/uL (ref 3.80–5.20)
RDW: 12.9 % (ref 11.5–14.5)
WBC: 8.6 10*3/uL (ref 3.6–11.0)

## 2016-08-18 LAB — LIPASE, BLOOD: LIPASE: 20 U/L (ref 11–51)

## 2016-08-18 LAB — POCT PREGNANCY, URINE: Preg Test, Ur: NEGATIVE

## 2016-08-18 MED ORDER — PROMETHAZINE HCL 25 MG/ML IJ SOLN
12.5000 mg | Freq: Once | INTRAMUSCULAR | Status: AC
  Administered 2016-08-18: 12.5 mg via INTRAVENOUS
  Filled 2016-08-18: qty 1

## 2016-08-18 MED ORDER — GI COCKTAIL ~~LOC~~
ORAL | Status: AC
  Administered 2016-08-18: 30 mL via ORAL
  Filled 2016-08-18: qty 30

## 2016-08-18 MED ORDER — DICYCLOMINE HCL 20 MG PO TABS: 20.0000 mg | ORAL_TABLET | Freq: Three times a day (TID) | ORAL | 0 refills | Status: DC | PRN

## 2016-08-18 MED ORDER — HYDROCODONE-ACETAMINOPHEN 5-325 MG PO TABS: 1.0000 | ORAL_TABLET | ORAL | 0 refills | Status: DC | PRN

## 2016-08-18 MED ORDER — OXYCODONE-ACETAMINOPHEN 5-325 MG PO TABS
1.0000 | ORAL_TABLET | Freq: Once | ORAL | Status: AC
  Administered 2016-08-18: 1 via ORAL
  Filled 2016-08-18: qty 1

## 2016-08-18 MED ORDER — DICYCLOMINE HCL 10 MG PO CAPS
10.0000 mg | ORAL_CAPSULE | Freq: Once | ORAL | Status: AC
  Administered 2016-08-18: 10 mg via ORAL
  Filled 2016-08-18: qty 1

## 2016-08-18 MED ORDER — PROMETHAZINE HCL 12.5 MG PO TABS: 12.5000 mg | ORAL_TABLET | Freq: Four times a day (QID) | ORAL | 0 refills | Status: DC | PRN

## 2016-08-18 MED ORDER — SODIUM CHLORIDE 0.9 % IV BOLUS (SEPSIS)
1000.0000 mL | Freq: Once | INTRAVENOUS | Status: AC
  Administered 2016-08-18: 1000 mL via INTRAVENOUS

## 2016-08-18 MED ORDER — GI COCKTAIL ~~LOC~~
30.0000 mL | Freq: Once | ORAL | Status: AC
  Administered 2016-08-18: 30 mL via ORAL

## 2016-08-18 MED ORDER — MORPHINE SULFATE (PF) 4 MG/ML IV SOLN
4.0000 mg | INTRAVENOUS | Status: DC | PRN
  Administered 2016-08-18: 4 mg via INTRAVENOUS
  Filled 2016-08-18: qty 1

## 2016-08-18 NOTE — ED Notes (Signed)
Pt up to go to the bathroom at this time with assistance from her mother. NAD noted. Pt tolerating well. Will continue to monitor for further patient needs.

## 2016-08-18 NOTE — ED Notes (Signed)
Report given to Kuch, RN.  

## 2016-08-18 NOTE — ED Triage Notes (Signed)
Pt reports mid upper abdominal pain that started couple days ago. Pt states she is unable to keep anything down for past few days. Pt states excessive vomiting and nausea.

## 2016-08-18 NOTE — ED Notes (Signed)
Pt discharged by Dora Sims, RN

## 2016-08-18 NOTE — ED Provider Notes (Signed)
[facility] Emergency Department Provider Note    None    (approximate)  I have reviewed the triage vital signs and the nursing notes.   HISTORY  Chief Complaint Emesis    HPI FATIHA GUZY is a 21 y.o. female with a history of bipolar disorder high blood pressure and seizures presents with 1 week of mid and right upper quadrant abdominal pain associated with nausea and vomiting. States she does still have her gallbladder. States his symptoms have been worsening. Saw her PCP on Monday and was given a prescription for Zofran. Since then she's not been able to have anything other than just toast and water. States the pain as 10 out of 10 in severity. No fevers but has had cold chills. Denies any dysuria.   Past Medical History:  Diagnosis Date  . Bipolar 1 disorder (HCC)   . Bipolar 1 disorder (HCC)   . Hypertension   . Seizures (HCC)   . Thyroid disease    hypo   No family history on file. Past Surgical History:  Procedure Laterality Date  . TONSILLECTOMY     Patient Active Problem List   Diagnosis Date Noted  . Valproic acid toxicity 05/13/2016      Prior to Admission medications   Medication Sig Start Date End Date Taking? Authorizing Provider  lamoTRIgine (LAMICTAL) 100 MG tablet Take 100 mg by mouth at bedtime.    Historical Provider, MD  levETIRAcetam (KEPPRA) 500 MG tablet Take 1 tablet (500 mg total) by mouth 2 (two) times daily. 05/14/16   Alford Highland, MD  OLANZapine (ZYPREXA) 20 MG tablet Take 20 mg by mouth daily.    Historical Provider, MD    Allergies Seroquel [quetiapine fumarate]    Social History Social History  Substance Use Topics  . Smoking status: Current Some Day Smoker    Types: Cigarettes  . Smokeless tobacco: Never Used  . Alcohol use Yes    Review of Systems Patient denies headaches, rhinorrhea, blurry vision, numbness, shortness of breath, chest pain, edema, cough, abdominal pain, nausea, vomiting,  diarrhea, dysuria, fevers, rashes or hallucinations unless otherwise stated above in HPI. ____________________________________________   PHYSICAL EXAM:  VITAL SIGNS: Vitals:   08/18/16 1134  BP: 122/87  Pulse: 75  Resp: 16  Temp: 98.2 F (36.8 C)    Constitutional: Alert and oriented. Uncomfortable appearing but in no acute distress. Eyes: Conjunctivae are normal. PERRL. EOMI. Head: Atraumatic. Nose: No congestion/rhinnorhea. Mouth/Throat: Mucous membranes are moist.  Oropharynx non-erythematous. Neck: No stridor. Painless ROM. No cervical spine tenderness to palpation Hematological/Lymphatic/Immunilogical: No cervical lymphadenopathy. Cardiovascular: Normal rate, regular rhythm. Grossly normal heart sounds.  Good peripheral circulation. Respiratory: Normal respiratory effort.  No retractions. Lungs CTAB. Gastrointestinal: Soft, mild ttp of RUQ. No distention. No abdominal bruits. No CVA tenderness. Musculoskeletal: No lower extremity tenderness nor edema.  No joint effusions. Neurologic:  Normal speech and language. No gross focal neurologic deficits are appreciated. No gait instability. Skin:  Skin is warm, dry and intact. No rash noted. Psychiatric: Mood and affect are normal. Speech and behavior are normal.  ____________________________________________   LABS (all labs ordered are listed, but only abnormal results are displayed)  Results for orders placed or performed during the hospital encounter of 08/18/16 (from the past 24 hour(s))  Lipase, blood     Status: None   Collection Time: 08/18/16 11:42 AM  Result Value Ref Range   Lipase 20 11 - 51 U/L  Comprehensive metabolic panel  Status: Abnormal   Collection Time: 08/18/16 11:42 AM  Result Value Ref Range   Sodium 139 135 - 145 mmol/L   Potassium 3.8 3.5 - 5.1 mmol/L   Chloride 108 101 - 111 mmol/L   CO2 23 22 - 32 mmol/L   Glucose, Bld 104 (H) 65 - 99 mg/dL   BUN 8 6 - 20 mg/dL   Creatinine, Ser 1.61 0.44  - 1.00 mg/dL   Calcium 9.9 8.9 - 09.6 mg/dL   Total Protein 8.0 6.5 - 8.1 g/dL   Albumin 4.7 3.5 - 5.0 g/dL   AST 21 15 - 41 U/L   ALT 16 14 - 54 U/L   Alkaline Phosphatase 74 38 - 126 U/L   Total Bilirubin 0.5 0.3 - 1.2 mg/dL   GFR calc non Af Amer >60 >60 mL/min   GFR calc Af Amer >60 >60 mL/min   Anion gap 8 5 - 15  CBC     Status: Abnormal   Collection Time: 08/18/16 11:42 AM  Result Value Ref Range   WBC 8.6 3.6 - 11.0 K/uL   RBC 5.16 3.80 - 5.20 MIL/uL   Hemoglobin 15.6 12.0 - 16.0 g/dL   HCT 04.5 (H) 40.9 - 81.1 %   MCV 91.4 80.0 - 100.0 fL   MCH 30.3 26.0 - 34.0 pg   MCHC 33.2 32.0 - 36.0 g/dL   RDW 91.4 78.2 - 95.6 %   Platelets 313 150 - 440 K/uL   ____________________________________________   ____________________________________________  RADIOLOGY  I personally reviewed all radiographic images ordered to evaluate for the above acute complaints and reviewed radiology reports and findings.  These findings were personally discussed with the patient.  Please see medical record for radiology report.  ____________________________________________   PROCEDURES  Procedure(s) performed:  Procedures    Critical Care performed: no ____________________________________________   INITIAL IMPRESSION / ASSESSMENT AND PLAN / ED COURSE  Pertinent labs & imaging results that were available during my care of the patient were reviewed by me and considered in my medical decision making (see chart for details).  DDX: cholelithiasis, cholecystitis, biliary colic, gastritis, enteritis  BRANNA CORTINA is a 21 y.o. who presents to the ED with Right upper quadrant pain as well as nausea and vomiting as described above. Patient is AFVSS in ED. Exam as above. Given current presentation have considered the above differential.  Laboratory is fairly reassuring but given that the pain in the right upper quadrant ultrasound was ordered to evaluate for any evidence of biliary  pathology. She does have some borderline gallbladder wall edema with a slightly enlarged common bile duct without any evidence of stone. Her blood work shows no evidence of biliary obstructive process in her lipase is normal. Patient's pain was controlled after IV fluids and IV pain medication and she was able to be transitioned to oral analgesics. Do feel the patient will be stable for follow-up for further workup with HIDA scan as an outpatient. Do not feel this reflects cholecystitis as she is afebrile with normal white count. Discussed strict return precautions.  Have discussed with the patient and available family all diagnostics and treatments performed thus far and all questions were answered to the best of my ability. The patient demonstrates understanding and agreement with plan.        ____________________________________________   FINAL CLINICAL IMPRESSION(S) / ED DIAGNOSES  Final diagnoses:  RUQ abdominal pain  Abnormal [place] (ultrasound) of abdomen      NEW MEDICATIONS STARTED  DURING THIS VISIT:  New Prescriptions   No medications on file     Note:  This document was prepared using Dragon voice recognition software and may include unintentional dictation errors.    Willy Eddy, MD 08/18/16 2114

## 2016-08-18 NOTE — ED Notes (Signed)
Pt given a meal tray and water per MD request. Pt instructed to attempt to eat. Pt states understanding, denies any further needs. Will continue to monitor for further patient needs.

## 2016-08-18 NOTE — ED Notes (Signed)
Patient unable to provide urine specimen at this time. Pt given specimen cup when able to provide specimen.

## 2016-08-18 NOTE — Discharge Instructions (Signed)
You have been seen in the emergency department for emergency care. It is important that you contact your own doctor, specialist or the closest clinic for follow-up care. Please bring this instruction sheet, all medications and X-ray copies with you when you are seen for follow-up care.  Determining the exact cause for all patients with abdominal pain is extremely difficult in the emergency department. Our primary focus is to rule-out immediate life-threatening diseases. If no immediate source of pain is found the definitive diagnosis frequently needs to be determined over time.Many times your primary care physician can determine the cause by following the symptoms over time. Sometimes, specialist are required such as Gastroenterologists, Gynecologists, Urologists or Surgeons. Please return immediately to the Emergency Department for fever>101, Vomiting or Intractable Pain. You should return to the emergency department or see your primary care provider in 12-24hrs if your pain is no better and sooner if your pain becomes worse.   FINDINGS: Gallbladder:   No shadowing gallstones are noted within gallbladder. There is thickening of gallbladder wall with edematous appearance measures 4.4 mm. Sonographic Murphy's sign cannot be assessed as the patient is medicated. Early cholecystitis cannot be excluded. Clinical correlation necessary.   Common bile duct:   Diameter: 6.7 in diameter distally which is prominent in size. No definite CBD filling defects are noted.   Liver:   No focal lesion identified. Within normal limits in parenchymal echogenicity.   IMPRESSION: 1. No shadowing gallstones are noted within gallbladder. There is thickening of gallbladder wall with edematous appearance measures 4.4 mm. Sonographic Murphy's sign cannot be assessed as the patient is medicated. Early cholecystitis cannot be excluded. Clinical correlation necessary. Dilated CBD up to 6.7 mm distally  without filling defects identified by ultrasound.

## 2016-08-23 ENCOUNTER — Encounter: Payer: Self-pay | Admitting: Emergency Medicine

## 2016-08-23 ENCOUNTER — Emergency Department
Admission: EM | Admit: 2016-08-23 | Discharge: 2016-08-23 | Disposition: A | Discharge status: Home / Self Care | Payer: Medicaid Other | Attending: Emergency Medicine | Admitting: Emergency Medicine

## 2016-08-23 ENCOUNTER — Emergency Department: Payer: Medicaid Other

## 2016-08-23 DIAGNOSIS — Z79899 Other long term (current) drug therapy: Secondary | ICD-10-CM | POA: Diagnosis not present

## 2016-08-23 DIAGNOSIS — R109 Unspecified abdominal pain: Secondary | ICD-10-CM

## 2016-08-23 DIAGNOSIS — N3090 Cystitis, unspecified without hematuria: Secondary | ICD-10-CM

## 2016-08-23 DIAGNOSIS — F17210 Nicotine dependence, cigarettes, uncomplicated: Secondary | ICD-10-CM | POA: Diagnosis not present

## 2016-08-23 DIAGNOSIS — I10 Essential (primary) hypertension: Secondary | ICD-10-CM | POA: Insufficient documentation

## 2016-08-23 DIAGNOSIS — R300 Dysuria: Secondary | ICD-10-CM | POA: Diagnosis present

## 2016-08-23 DIAGNOSIS — E039 Hypothyroidism, unspecified: Secondary | ICD-10-CM | POA: Diagnosis not present

## 2016-08-23 LAB — CBC
HCT: 41.1 % (ref 35.0–47.0)
HEMOGLOBIN: 14 g/dL (ref 12.0–16.0)
MCH: 31.1 pg (ref 26.0–34.0)
MCHC: 34 g/dL (ref 32.0–36.0)
MCV: 91.3 fL (ref 80.0–100.0)
Platelets: 247 10*3/uL (ref 150–440)
RBC: 4.5 MIL/uL (ref 3.80–5.20)
RDW: 12.5 % (ref 11.5–14.5)
WBC: 9.3 10*3/uL (ref 3.6–11.0)

## 2016-08-23 LAB — COMPREHENSIVE METABOLIC PANEL
ALK PHOS: 63 U/L (ref 38–126)
ALT: 17 U/L (ref 14–54)
AST: 18 U/L (ref 15–41)
Albumin: 4.5 g/dL (ref 3.5–5.0)
Anion gap: 6 (ref 5–15)
BILIRUBIN TOTAL: 0.7 mg/dL (ref 0.3–1.2)
BUN: 9 mg/dL (ref 6–20)
CALCIUM: 9.3 mg/dL (ref 8.9–10.3)
CO2: 25 mmol/L (ref 22–32)
Chloride: 106 mmol/L (ref 101–111)
Creatinine, Ser: 0.72 mg/dL (ref 0.44–1.00)
Glucose, Bld: 87 mg/dL (ref 65–99)
Potassium: 3.6 mmol/L (ref 3.5–5.1)
Sodium: 137 mmol/L (ref 135–145)
Total Protein: 7.2 g/dL (ref 6.5–8.1)

## 2016-08-23 LAB — URINALYSIS, COMPLETE (UACMP) WITH MICROSCOPIC
BILIRUBIN URINE: NEGATIVE
GLUCOSE, UA: NEGATIVE mg/dL
HGB URINE DIPSTICK: NEGATIVE
Ketones, ur: NEGATIVE mg/dL
LEUKOCYTES UA: NEGATIVE
NITRITE: POSITIVE — AB
Protein, ur: NEGATIVE mg/dL
SPECIFIC GRAVITY, URINE: 1.019 (ref 1.005–1.030)
pH: 5 (ref 5.0–8.0)

## 2016-08-23 LAB — POCT PREGNANCY, URINE: Preg Test, Ur: NEGATIVE

## 2016-08-23 LAB — LIPASE, BLOOD: Lipase: 20 U/L (ref 11–51)

## 2016-08-23 MED ORDER — ONDANSETRON 4 MG PO TBDP: 4.0000 mg | ORAL_TABLET | Freq: Three times a day (TID) | ORAL | 0 refills | Status: DC | PRN

## 2016-08-23 MED ORDER — NAPROXEN 500 MG PO TABS: 500.0000 mg | ORAL_TABLET | Freq: Two times a day (BID) | ORAL | 0 refills | Status: DC

## 2016-08-23 MED ORDER — IOPAMIDOL (ISOVUE-300) INJECTION 61%
100.0000 mL | Freq: Once | INTRAVENOUS | Status: AC | PRN
  Administered 2016-08-23: 100 mL via INTRAVENOUS
  Filled 2016-08-23: qty 100

## 2016-08-23 MED ORDER — SODIUM CHLORIDE 0.9 % IV BOLUS (SEPSIS)
1000.0000 mL | Freq: Once | INTRAVENOUS | Status: AC
  Administered 2016-08-23: 1000 mL via INTRAVENOUS

## 2016-08-23 MED ORDER — IOPAMIDOL (ISOVUE-300) INJECTION 61%
30.0000 mL | Freq: Once | INTRAVENOUS | Status: AC
  Administered 2016-08-23: 30 mL via ORAL
  Filled 2016-08-23: qty 30

## 2016-08-23 MED ORDER — MORPHINE SULFATE (PF) 4 MG/ML IV SOLN
4.0000 mg | Freq: Once | INTRAVENOUS | Status: AC
  Administered 2016-08-23: 4 mg via INTRAVENOUS
  Filled 2016-08-23: qty 1

## 2016-08-23 MED ORDER — ONDANSETRON HCL 4 MG/2ML IJ SOLN
4.0000 mg | Freq: Once | INTRAMUSCULAR | Status: AC
  Administered 2016-08-23: 4 mg via INTRAVENOUS
  Filled 2016-08-23: qty 2

## 2016-08-23 MED ORDER — CEFDINIR 300 MG PO CAPS: 300.0000 mg | ORAL_CAPSULE | Freq: Two times a day (BID) | ORAL | 0 refills | Status: DC

## 2016-08-23 NOTE — ED Notes (Signed)
Patient transported to CT 

## 2016-08-23 NOTE — ED Provider Notes (Signed)
[facility] Emergency Department Provider Note  ____________________________________________  Time seen: Approximately 5:08 PM  I have reviewed the triage vital signs and the nursing notes.   HISTORY  Chief Complaint Abdominal Pain    HPI Alexandra Solis is a 21 y.o. female who complains of right-sided abdominal pain for the past 10 days or so. Associated with nausea and vomiting and oral intolerance. She is not eating or drinking very much at all because of the symptoms. Pain is waxing and waning and cramping. Radiates to her back from the right side. No fevers or chills. No diarrhea or constipation. Seen in the ED a week ago, had an ultrasound suggestive of biliary disease, but labs and vital signs were unremarkable. Patient was scheduled for outpatient follow-up with GI which she has appointment on 09/15/2016. However, she reports that her symptoms continue to plague her and she comes back to the ED for reevaluation. Pain does seem to be worse with eating. No alleviating factors. Moderate intensity.  She also reports dysuria.     Past Medical History:  Diagnosis Date  . Bipolar 1 disorder (HCC)   . Bipolar 1 disorder (HCC)   . Hypertension   . Seizures (HCC)   . Thyroid disease    hypo     Patient Active Problem List   Diagnosis Date Noted  . 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 TONSILLECTOMY       Prior to Admission medications   Medication Sig Start Date End Date Taking? Authorizing Provider  cefdinir (OMNICEF) 300 MG capsule Take 1 capsule (300 mg total) by mouth 2 (two) times daily. 08/23/16   Sharman Cheek, MD  dicyclomine (BENTYL) 20 MG tablet Take 1 tablet (20 mg total) by mouth 3 (three) times daily as needed for spasms. 08/18/16 08/18/17  Willy Eddy, MD  HYDROcodone-acetaminophen (NORCO) 5-325 MG tablet Take 1 tablet by mouth every 4 (four) hours as needed for moderate  pain. 08/18/16   Willy Eddy, MD  lamoTRIgine (LAMICTAL) 100 MG tablet Take 100 mg by mouth at bedtime.    Historical Provider, MD  levETIRAcetam (KEPPRA) 500 MG tablet Take 1 tablet (500 mg total) by mouth 2 (two) times daily. 05/14/16   Alford Highland, MD  naproxen (NAPROSYN) 500 MG tablet Take 1 tablet (500 mg total) by mouth 2 (two) times daily with a meal. 08/23/16   Sharman Cheek, MD  OLANZapine (ZYPREXA) 20 MG tablet Take 20 mg by mouth daily.    Historical Provider, MD  ondansetron (ZOFRAN ODT) 4 MG disintegrating tablet Take 1 tablet (4 mg total) by mouth every 8 (eight) hours as needed for nausea or vomiting. 08/23/16   Sharman Cheek, MD  promethazine (PHENERGAN) 12.5 MG tablet Take 1 tablet (12.5 mg total) by mouth every 6 (six) hours as needed for nausea or vomiting. 08/18/16   Willy Eddy, MD     Allergies Seroquel [quetiapine fumarate]   History reviewed. No pertinent family history.  Social History Social History  Substance Use Topics  . Smoking status: Current Some Day Smoker    Types: Cigarettes  . Smokeless tobacco: Never Used  . Alcohol use Yes    Review of Systems  Constitutional:   No fever or chills.  ENT:   No sore throat. No rhinorrhea. Cardiovascular:   No chest pain. Respiratory:   No dyspnea or cough. Gastrointestinal:   Positive abdominal pain as above with vomiting. No diarrhea.Marland Kitchen  Genitourinary:   Positive dysuria.. Musculoskeletal:   Negative for focal pain or swelling Neurological:   Negative for headaches 10-point ROS otherwise negative.  ____________________________________________   PHYSICAL EXAM:  VITAL SIGNS: ED Triage Vitals  Enc Vitals Group     BP 08/23/16 1459 (!) 149/100     Pulse Rate 08/23/16 1459 73     Resp 08/23/16 1459 18     Temp 08/23/16 1459 98.2 F (36.8 C)     Temp Source 08/23/16 1459 Oral     SpO2 08/23/16 1459 99 %     Weight 08/23/16 1500 230 lb (104.3 kg)     Height --      Head Circumference --       Peak Flow --      Pain Score 08/23/16 1459 10     Pain Loc --      Pain Edu? --      Excl. in GC? --     Vital signs reviewed, nursing assessments reviewed.   Constitutional:   Alert and oriented. Well appearing and in no distress. Eyes:   No scleral icterus. No conjunctival pallor. PERRL. EOMI.  No nystagmus. ENT   Head:   Normocephalic and atraumatic.   Nose:   No congestion/rhinnorhea. No septal hematoma   Mouth/Throat:   MMM, no pharyngeal erythema. No peritonsillar mass.    Neck:   No stridor. No SubQ emphysema. No meningismus. Hematological/Lymphatic/Immunilogical:   No cervical lymphadenopathy. Cardiovascular:   RRR. Symmetric bilateral radial and DP pulses.  No murmurs.  Respiratory:   Normal respiratory effort without tachypnea nor retractions. Breath sounds are clear and equal bilaterally. No wheezes/rales/rhonchi. Gastrointestinal:   Soft With diffuse right-sided tenderness in upper and lower abdomen. Positive suprapubic tenderness. Non distended. There is no CVA tenderness.  No rebound, rigidity, or guarding. Genitourinary:   deferred Musculoskeletal:   Normal range of motion in all extremities. No joint effusions.  No lower extremity tenderness.  No edema. Neurologic:   Normal speech and language.  CN 2-10 normal. Motor grossly intact. No gross focal neurologic deficits are appreciated.  Skin:    Skin is warm, dry and intact. No rash noted.  No petechiae, purpura, or bullae.  ____________________________________________    LABS (pertinent positives/negatives) (all labs ordered are listed, but only abnormal results are displayed) Labs Reviewed  URINALYSIS, COMPLETE (UACMP) WITH MICROSCOPIC - Abnormal; Notable for the following:       Result Value   Color, Urine YELLOW (*)    APPearance HAZY (*)    Nitrite POSITIVE (*)    Bacteria, UA RARE (*)    Squamous Epithelial / LPF 0-5 (*)    All other components within normal limits  URINE CULTURE   LIPASE, BLOOD  COMPREHENSIVE METABOLIC PANEL  CBC  POC URINE PREG, ED  POCT PREGNANCY, URINE   ____________________________________________   EKG    ____________________________________________    RADIOLOGY  Ct Abdomen Pelvis W Contrast  Result Date: 08/23/2016 CLINICAL DATA:  21 year old female complaining of right-sided abdominal pain and cramping. EXAM: CT ABDOMEN AND PELVIS WITH CONTRAST TECHNIQUE: Multidetector CT imaging of the abdomen and pelvis was performed using the standard protocol following bolus administration of intravenous contrast. CONTRAST:  ISOVUE-300 IOPAMIDOL (ISOVUE-300) INJECTION 61% COMPARISON:  No priors. FINDINGS: Lower chest: Unremarkable. Hepatobiliary: No cystic or solid hepatic lesions. No intra or extrahepatic biliary ductal dilatation. Gallbladder is normal in appearance. Pancreas: No pancreatic mass. No pancreatic ductal dilatation. No pancreatic or peripancreatic fluid or inflammatory changes.  Spleen: Unremarkable. Adrenals/Urinary Tract: Bilateral kidneys and bilateral adrenal glands are normal in appearance. No hydroureteronephrosis. Urinary bladder is normal in appearance. Stomach/Bowel: The appearance of the stomach is normal. There is no pathologic dilatation of small bowel or colon. Normal appendix. Vascular/Lymphatic: No significant atherosclerotic disease, aneurysm or dissection noted in the abdominal or pelvic vasculature. No lymphadenopathy noted in the abdomen or pelvis. Reproductive: Uterus and ovaries are unremarkable in appearance. Other: Trace volume of free fluid in the cul-de-sac, presumably physiologic in this young female patient. Musculoskeletal: There are no aggressive appearing lytic or blastic lesions noted in the visualized portions of the skeleton. IMPRESSION: 1. No acute findings are noted in the abdomen or pelvis to account for the patient's symptoms. 2. Normal appendix. 3. Trace volume of free fluid in the cul-de-sac,  presumably physiologic in this young female patient. Electronically Signed   By: Trudie Reed M.D.   On: 08/23/2016 17:37    ____________________________________________   PROCEDURES Procedures  ____________________________________________   INITIAL IMPRESSION / ASSESSMENT AND PLAN / ED COURSE  Pertinent labs & imaging results that were available during my care of the patient were reviewed by me and considered in my medical decision making (see chart for details).  Patient presents again for right-sided abdominal pain. Symptoms and exam suggestive of UTI, but also differential includes bowel obstruction, appendicitis, cholecystitis choledocholithiasis or pancreatitis. She is overall well-appearing, vital signs unremarkable, but due to ongoing nature, we'll check a CT scan today.   ----------------------------------------- 6:33 PM on 08/23/2016 -----------------------------------------  CT negative. Vital signs remained stable, essentially normal and afebrile. Patient well appearing, tolerating oral intake. Workup does reveal nitrite positive urinalysis. We'll treat for UTI and have her follow-up with primary care for reassessment. I suspect that her symptoms will significantly improve with antibiotics. I'll provide her with Zofran as well and recommended NSAIDs.Considering the patient's symptoms, medical history, and physical examination today, I have low suspicion for cholecystitis or biliary pathology, pancreatitis, perforation or bowel obstruction, hernia, intra-abdominal abscess, AAA or dissection, volvulus or intussusception, mesenteric ischemia, or appendicitis. Low suspicion for torsion or PID TOA.        ____________________________________________   FINAL CLINICAL IMPRESSION(S) / ED DIAGNOSES  Final diagnoses:  Abdominal pain, unspecified abdominal location  Cystitis      New Prescriptions   CEFDINIR (OMNICEF) 300 MG CAPSULE    Take 1 capsule (300 mg total) by  mouth 2 (two) times daily.   NAPROXEN (NAPROSYN) 500 MG TABLET    Take 1 tablet (500 mg total) by mouth 2 (two) times daily with a meal.   ONDANSETRON (ZOFRAN ODT) 4 MG DISINTEGRATING TABLET    Take 1 tablet (4 mg total) by mouth every 8 (eight) hours as needed for nausea or vomiting.     Portions of this note were generated with dragon dictation software. Dictation errors may occur despite best attempts at proofreading.    Sharman Cheek, MD 08/23/16 [phone]

## 2016-08-23 NOTE — ED Notes (Signed)
Pt finished with contrast. CT notified. 

## 2016-08-23 NOTE — ED Triage Notes (Signed)
Pt to ed with c/o right sided abd pain and cramping.  Pt states she was seen here last week for same and was referred to GI MD in early May

## 2016-08-25 LAB — URINE CULTURE: Culture: >=100000 — AB

## 2016-08-26 NOTE — Progress Notes (Signed)
ED Antimicrobial Stewardship Positive Culture Follow Up   Alexandra Solis is an 21 y.o. female who presented to [facility] on 08/23/2016 with a chief complaint of  Chief Complaint  Patient presents with  . Abdominal Pain    Recent Results (from the past 720 hour(s))  Urine culture     Status: Abnormal   Collection Time: 08/23/16  5:09 PM  Result Value Ref Range Status   Specimen Description URINE, RANDOM  Final   Special Requests NONE  Final   Culture >=100,000 COLONIES/mL ESCHERICHIA COLI (A)  Final   Report Status 08/25/2016 FINAL  Final   Organism ID, Bacteria ESCHERICHIA COLI (A)  Final      Susceptibility   Escherichia coli - MIC*    AMPICILLIN <=2 SENSITIVE Sensitive     CEFAZOLIN <=4 SENSITIVE Sensitive     CEFTRIAXONE <=1 SENSITIVE Sensitive     CIPROFLOXACIN <=0.25 SENSITIVE Sensitive     GENTAMICIN <=1 SENSITIVE Sensitive     IMIPENEM <=0.25 SENSITIVE Sensitive     NITROFURANTOIN <=16 SENSITIVE Sensitive     TRIMETH/SULFA <=20 SENSITIVE Sensitive     AMPICILLIN/SULBACTAM <=2 SENSITIVE Sensitive     PIP/TAZO <=4 SENSITIVE Sensitive     Extended ESBL NEGATIVE Sensitive     * >=100,000 COLONIES/mL ESCHERICHIA COLI   Patient D/C on appropriate antibiotic therapy.   Delsa Bern, PharmD 08/26/2016, 9:16 AM

## 2016-09-15 ENCOUNTER — Encounter: Payer: Self-pay | Admitting: Gastroenterology

## 2016-09-15 ENCOUNTER — Other Ambulatory Visit: Payer: Self-pay

## 2016-09-15 ENCOUNTER — Ambulatory Visit (INDEPENDENT_AMBULATORY_CARE_PROVIDER_SITE_OTHER): Payer: Medicaid Other | Admitting: Gastroenterology

## 2016-09-15 VITALS — BP 141/90 | HR 57 | Temp 98.1°F | Ht 71.0 in | Wt 224.0 lb

## 2016-09-15 DIAGNOSIS — I10 Essential (primary) hypertension: Secondary | ICD-10-CM | POA: Insufficient documentation

## 2016-09-15 DIAGNOSIS — E079 Disorder of thyroid, unspecified: Secondary | ICD-10-CM | POA: Insufficient documentation

## 2016-09-15 DIAGNOSIS — R1011 Right upper quadrant pain: Secondary | ICD-10-CM

## 2016-09-15 NOTE — Patient Instructions (Signed)
You are scheduled for a [facility] on May 24th @ 8:30am. Please arrive at 8:15am and check in at the medical mall.   If you need to reschedule this appointment for any reason, please contact Central Scheduling at [phone].

## 2016-09-15 NOTE — Progress Notes (Signed)
Gastroenterology Consultation  Referring Provider:     Armando Gang, FNP Primary Care Physician:  Armando Gang, FNP Primary Gastroenterologist:  Dr. Servando Snare     Reason for Consultation:     Right-sided abdominal pain        HPI:   Alexandra Solis is a 21 y.o. y/o female referred for consultation & management of Right-sided abdominal pain by Dr. Clint Guy, Fernand Parkins, FNP.  This patient comes in today after being seen in emergency room twice last month. The patient was first seen in the emergency room and had an ultrasound of the right upper quadrant and was shown to have a thickened gallbladder wall and was set up for evaluation by GI. The patient started to have the pain again prior to her GI appointment and went back to the emergency room. At the time of her emergency room visit the patient was thought to have cystitis based on her urine showing over 100,000 colonies of Escherichia coli. At her last ER visit her CMP was normal. The patient states she was started on antibodies for the urinary tract infection. The patient also reports that her abdominal pain can wake her up in the middle of night and is worse when she takes a deep breath in cough sneezes and when she eats fatty or greasy foods.  Past Medical History:  Diagnosis Date  . Bipolar 1 disorder (HCC)   . Bipolar 1 disorder (HCC)   . Hypertension   . Seizures (HCC)   . Thyroid disease    hypo    Past Surgical History:  Procedure Laterality Date  . TONSILLECTOMY      Prior to Admission medications   Medication Sig Start Date End Date Taking? Authorizing Provider  cefdinir (OMNICEF) 300 MG capsule Take 1 capsule (300 mg total) by mouth 2 (two) times daily. 08/23/16   Sharman Cheek, MD  dicyclomine (BENTYL) 20 MG tablet Take 1 tablet (20 mg total) by mouth 3 (three) times daily as needed for spasms. 08/18/16 08/18/17  Willy Eddy, MD  HYDROcodone-acetaminophen (NORCO) 5-325 MG tablet Take 1 tablet by mouth every 4  (four) hours as needed for moderate pain. 08/18/16   Willy Eddy, MD  lamoTRIgine (LAMICTAL) 100 MG tablet Take 100 mg by mouth at bedtime.    [provider]  levETIRAcetam (KEPPRA) 500 MG tablet Take 1 tablet (500 mg total) by mouth 2 (two) times daily. 05/14/16   Alford Highland, MD  naproxen (NAPROSYN) 500 MG tablet Take 1 tablet (500 mg total) by mouth 2 (two) times daily with a meal. 08/23/16   Sharman Cheek, MD  OLANZapine (ZYPREXA) 20 MG tablet Take 20 mg by mouth daily.    [provider]  ondansetron (ZOFRAN ODT) 4 MG disintegrating tablet Take 1 tablet (4 mg total) by mouth every 8 (eight) hours as needed for nausea or vomiting. 08/23/16   Sharman Cheek, MD  promethazine (PHENERGAN) 12.5 MG tablet Take 1 tablet (12.5 mg total) by mouth every 6 (six) hours as needed for nausea or vomiting. 08/18/16   Willy Eddy, MD    No family history on file.   Social History  Substance Use Topics  . Smoking status: Current Some Day Smoker    Types: Cigarettes  . Smokeless tobacco: Never Used  . Alcohol use Yes    Allergies as of 09/15/2016 - Review Complete 08/23/2016  Allergen Reaction Noted  . Seroquel [quetiapine fumarate] Other (See Comments) 05/04/2014    Review of  Systems:    All systems reviewed and negative except where noted in HPI.   Physical Exam:  There were no vitals taken for this visit. No LMP recorded. Psych:  Alert and cooperative. Normal mood and affect. General:   Alert,  Well-developed, well-nourished, pleasant and cooperative in NAD Head:  Normocephalic and atraumatic. Eyes:  Sclera clear, no icterus.   Conjunctiva pink. Ears:  Normal auditory acuity. Nose:  No deformity, discharge, or lesions. Mouth:  No deformity or lesions,oropharynx pink & moist. Neck:  Supple; no masses or thyromegaly. Lungs:  Respirations even and unlabored.  Clear throughout to auscultation.   No wheezes, crackles, or rhonchi. No acute distress. Heart:   Regular rate and rhythm; no murmurs, clicks, rubs, or gallops. Abdomen:  Normal bowel sounds.  No bruits.  Soft, non-tender and non-distended without masses, hepatosplenomegaly or hernias noted.  No guarding or rebound tenderness.  Negative Carnett sign.   Rectal:  Deferred.  Msk:  Symmetrical without gross deformities.  Good, equal movement & strength bilaterally. Pulses:  Normal pulses noted. Extremities:  No clubbing or edema.  No cyanosis. Neurologic:  Alert and oriented x3;  grossly normal neurologically. Skin:  Intact without significant lesions or rashes.  No jaundice. Lymph Nodes:  No significant cervical adenopathy. Psych:  Alert and cooperative. Normal mood and affect.  Imaging Studies: Ct Abdomen Pelvis W Contrast  Result Date: 08/23/2016 CLINICAL DATA:  21 year old female complaining of right-sided abdominal pain and cramping. EXAM: CT ABDOMEN AND PELVIS WITH CONTRAST TECHNIQUE: Multidetector CT imaging of the abdomen and pelvis was performed using the standard protocol following bolus administration of intravenous contrast. CONTRAST:  100mL ISOVUE-300 IOPAMIDOL (ISOVUE-300) INJECTION 61% COMPARISON:  No priors. FINDINGS: Lower chest: Unremarkable. Hepatobiliary: No cystic or solid hepatic lesions. No intra or extrahepatic biliary ductal dilatation. Gallbladder is normal in appearance. Pancreas: No pancreatic mass. No pancreatic ductal dilatation. No pancreatic or peripancreatic fluid or inflammatory changes. Spleen: Unremarkable. Adrenals/Urinary Tract: Bilateral kidneys and bilateral adrenal glands are normal in appearance. No hydroureteronephrosis. Urinary bladder is normal in appearance. Stomach/Bowel: The appearance of the stomach is normal. There is no pathologic dilatation of small bowel or colon. Normal appendix. Vascular/Lymphatic: No significant atherosclerotic disease, aneurysm or dissection noted in the abdominal or pelvic vasculature. No lymphadenopathy noted in the abdomen or  pelvis. Reproductive: Uterus and ovaries are unremarkable in appearance. Other: Trace volume of free fluid in the cul-de-sac, presumably physiologic in this young female patient. Musculoskeletal: There are no aggressive appearing lytic or blastic lesions noted in the visualized portions of the skeleton. IMPRESSION: 1. No acute findings are noted in the abdomen or pelvis to account for the patient's symptoms. 2. Normal appendix. 3. Trace volume of free fluid in the cul-de-sac, presumably physiologic in this young female patient. Electronically Signed   By: Trudie Reedaniel  Entrikin M.D.   On: 08/23/2016 17:37   [place] Abdomen Limited Ruq  Result Date: 08/18/2016 CLINICAL DATA:  Intermittent nausea and vomiting for 1 week EXAM: US ABDOMEN LIMITED - RIGHT UPPER QUADRANT COMPARISON:  None. FINDINGS: Gallbladder: No shadowing gallstones are noted within gallbladder. There is thickening of gallbladder wall with edematous appearance measures 4.4 mm. Sonographic Murphy's sign cannot be assessed as the patient is medicated. Early cholecystitis cannot be excluded. Clinical correlation necessary. Common bile duct: Diameter: 6.7 in diameter distally which is prominent in size. No definite CBD filling defects are noted. Liver: No focal lesion identified. Within normal limits in parenchymal echogenicity. IMPRESSION: 1. No shadowing gallstones are noted within gallbladder.  There is thickening of gallbladder wall with edematous appearance measures 4.4 mm. Sonographic Murphy's sign cannot be assessed as the patient is medicated. Early cholecystitis cannot be excluded. Clinical correlation necessary. Dilated CBD up to 6.7 mm distally without filling defects identified by ultrasound. Electronically Signed   By: Natasha Mead M.D.   On: 08/18/2016 14:52    Assessment and Plan:   Alexandra Solis is a 21 y.o. y/o female who was seen in the emergency room twice last month. The first time she was thought to have gallbladder problems due to her  right upper quadrant abdominal pain and thickened gallbladder wall. The patient was then diagnosed with cystitis with a positive urinalysis for Escherichia coli. The patient reports that her abdominal pain is in the right upper quadrant and is worse with movement and eating. She also has the pain wake her up from a sleep. The patient has not lost any weight due to her pain. The patient may have musculoskeletal pain versus gallbladder issues. The patient will be set up for a HIDA scan with [facility]. If this is negative the patient will be treated as muscular pain. The patient has been explained the plan and agrees with it.    Midge Minium, MD. Clementeen Graham   Note: This dictation was prepared with Dragon dictation along with smaller phrase technology. Any transcriptional errors that result from this process are unintentional.

## 2016-09-28 ENCOUNTER — Other Ambulatory Visit: Payer: Self-pay

## 2016-09-28 DIAGNOSIS — Z01812 Encounter for preprocedural laboratory examination: Secondary | ICD-10-CM

## 2016-09-29 ENCOUNTER — Other Ambulatory Visit
Admission: RE | Admit: 2016-09-29 | Discharge: 2016-09-29 | Disposition: A | Discharge status: Home / Self Care | Payer: Self-pay | Source: Ambulatory Visit | Attending: Gastroenterology | Admitting: Gastroenterology

## 2016-09-29 ENCOUNTER — Encounter: Payer: Self-pay | Admitting: Gastroenterology

## 2016-09-29 ENCOUNTER — Ambulatory Visit
Admission: RE | Admit: 2016-09-29 | Discharge: 2016-09-29 | Disposition: A | Discharge status: Home / Self Care | Payer: Self-pay | Source: Ambulatory Visit | Attending: Gastroenterology | Admitting: Gastroenterology

## 2016-09-29 DIAGNOSIS — R112 Nausea with vomiting, unspecified: Secondary | ICD-10-CM | POA: Insufficient documentation

## 2016-09-29 DIAGNOSIS — Z01812 Encounter for preprocedural laboratory examination: Secondary | ICD-10-CM

## 2016-09-29 DIAGNOSIS — R109 Unspecified abdominal pain: Secondary | ICD-10-CM | POA: Insufficient documentation

## 2016-09-29 DIAGNOSIS — R1011 Right upper quadrant pain: Secondary | ICD-10-CM | POA: Insufficient documentation

## 2016-09-29 LAB — PREGNANCY, URINE: Preg Test, Ur: NEGATIVE

## 2016-09-29 MED ORDER — TECHNETIUM TC 99M MEBROFENIN IV KIT
5.0230 | PACK | Freq: Once | INTRAVENOUS | Status: AC | PRN
  Administered 2016-09-29: 5.023 via INTRAVENOUS

## 2016-10-07 ENCOUNTER — Telehealth: Payer: Self-pay | Admitting: Gastroenterology

## 2016-10-07 NOTE — Telephone Encounter (Signed)
Results from scan please

## 2016-10-11 ENCOUNTER — Telehealth: Payer: Self-pay | Admitting: Gastroenterology

## 2016-10-11 NOTE — Telephone Encounter (Signed)
Patient wanting results of Hida scan, please call.

## 2016-10-11 NOTE — Telephone Encounter (Signed)
Re-routed to GI

## 2016-10-14 ENCOUNTER — Telehealth: Payer: Self-pay

## 2016-10-14 NOTE — Telephone Encounter (Signed)
-----   Message from Midge Miniumarren Wohl, MD sent at 10/04/2016 11:25 AM EDT ----- Have this patient come in to talk about the next steps after her gallbladder emptying study.

## 2016-10-14 NOTE — Telephone Encounter (Signed)
Left vm with pt's mother for pt to return my call. Also tried contacting pt's cell but number was not working.

## 2016-10-17 NOTE — Telephone Encounter (Signed)
Patient is calling for results from scan

## 2016-10-27 NOTE — Telephone Encounter (Signed)
Pt returned call and is scheduled for a follow up appt on 11/21/16.

## 2016-11-21 ENCOUNTER — Ambulatory Visit: Payer: Medicaid Other | Admitting: Gastroenterology

## 2016-11-21 ENCOUNTER — Encounter: Payer: Self-pay | Admitting: *Deleted

## 2016-11-21 ENCOUNTER — Encounter: Payer: Self-pay | Admitting: Gastroenterology

## 2017-02-20 ENCOUNTER — Emergency Department
Admission: EM | Admit: 2017-02-20 | Discharge: 2017-02-20 | Disposition: A | Discharge status: Home / Self Care | Payer: Self-pay | Attending: Emergency Medicine | Admitting: Emergency Medicine

## 2017-02-20 ENCOUNTER — Emergency Department: Payer: Self-pay

## 2017-02-20 ENCOUNTER — Encounter: Payer: Self-pay | Admitting: Emergency Medicine

## 2017-02-20 DIAGNOSIS — F17210 Nicotine dependence, cigarettes, uncomplicated: Secondary | ICD-10-CM | POA: Insufficient documentation

## 2017-02-20 DIAGNOSIS — Y999 Unspecified external cause status: Secondary | ICD-10-CM | POA: Insufficient documentation

## 2017-02-20 DIAGNOSIS — Z79899 Other long term (current) drug therapy: Secondary | ICD-10-CM | POA: Insufficient documentation

## 2017-02-20 DIAGNOSIS — Y929 Unspecified place or not applicable: Secondary | ICD-10-CM | POA: Insufficient documentation

## 2017-02-20 DIAGNOSIS — Y939 Activity, unspecified: Secondary | ICD-10-CM | POA: Insufficient documentation

## 2017-02-20 DIAGNOSIS — I10 Essential (primary) hypertension: Secondary | ICD-10-CM | POA: Insufficient documentation

## 2017-02-20 DIAGNOSIS — R51 Headache: Secondary | ICD-10-CM | POA: Insufficient documentation

## 2017-02-20 DIAGNOSIS — S39012A Strain of muscle, fascia and tendon of lower back, initial encounter: Secondary | ICD-10-CM | POA: Insufficient documentation

## 2017-02-20 DIAGNOSIS — S161XXA Strain of muscle, fascia and tendon at neck level, initial encounter: Secondary | ICD-10-CM | POA: Insufficient documentation

## 2017-02-20 LAB — POCT PREGNANCY, URINE: PREG TEST UR: NEGATIVE

## 2017-02-20 MED ORDER — CYCLOBENZAPRINE HCL 10 MG PO TABS: 10.0000 mg | ORAL_TABLET | Freq: Three times a day (TID) | ORAL | 0 refills | Status: DC | PRN

## 2017-02-20 MED ORDER — PREDNISONE 10 MG (21) PO TBPK: ORAL_TABLET | ORAL | 0 refills | Status: DC

## 2017-02-20 MED ORDER — DEXAMETHASONE SODIUM PHOSPHATE 10 MG/ML IJ SOLN
10.0000 mg | Freq: Once | INTRAMUSCULAR | Status: AC
Start: 2017-02-20 — End: 2017-02-20
  Administered 2017-02-20: 10 mg via INTRAMUSCULAR
  Filled 2017-02-20: qty 1
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
## 2017-02-20 NOTE — Discharge Instructions (Signed)
Take medication as prescribed. Return to emergency department if symptoms worsen and follow-up with PCP as needed.   °

## 2017-02-20 NOTE — ED Notes (Signed)
Pt discharged to home.  Family member driving.  Discharge instructions reviewed.  Verbalized understanding.  No questions or concerns at this time.  Teach back verified.  Pt in NAD.  No items left in ED.   
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
## 2017-02-20 NOTE — ED Notes (Signed)
Urine pregnancy -  Negative 

## 2017-04-14 DIAGNOSIS — E039 Hypothyroidism, unspecified: Secondary | ICD-10-CM | POA: Insufficient documentation

## 2017-04-14 DIAGNOSIS — I10 Essential (primary) hypertension: Secondary | ICD-10-CM | POA: Insufficient documentation

## 2017-04-14 DIAGNOSIS — R112 Nausea with vomiting, unspecified: Secondary | ICD-10-CM | POA: Insufficient documentation

## 2017-04-14 DIAGNOSIS — F17210 Nicotine dependence, cigarettes, uncomplicated: Secondary | ICD-10-CM | POA: Insufficient documentation

## 2017-04-14 DIAGNOSIS — Z79899 Other long term (current) drug therapy: Secondary | ICD-10-CM | POA: Insufficient documentation

## 2017-04-14 DIAGNOSIS — R1031 Right lower quadrant pain: Secondary | ICD-10-CM | POA: Insufficient documentation

## 2017-04-14 NOTE — ED Triage Notes (Signed)
Pt states abd pain, nausea and vomiting that began at 2030 today. Pt appears in no acute distress.

## 2017-04-15 ENCOUNTER — Emergency Department
Admission: EM | Admit: 2017-04-15 | Discharge: 2017-04-15 | Disposition: A | Discharge status: Home / Self Care | Payer: Self-pay | Attending: Emergency Medicine | Admitting: Emergency Medicine

## 2017-04-15 DIAGNOSIS — R112 Nausea with vomiting, unspecified: Secondary | ICD-10-CM

## 2017-04-15 DIAGNOSIS — R109 Unspecified abdominal pain: Secondary | ICD-10-CM

## 2017-04-15 LAB — CBC
HCT: 44.3 % (ref 35.0–47.0)
Hemoglobin: 14.8 g/dL (ref 12.0–16.0)
MCH: 30.8 pg (ref 26.0–34.0)
MCHC: 33.5 g/dL (ref 32.0–36.0)
MCV: 92 fL (ref 80.0–100.0)
Platelets: 260 10*3/uL (ref 150–440)
RBC: 4.81 MIL/uL (ref 3.80–5.20)
RDW: 13.2 % (ref 11.5–14.5)
WBC: 9.9 10*3/uL (ref 3.6–11.0)

## 2017-04-15 LAB — COMPREHENSIVE METABOLIC PANEL
ALT: 19 U/L (ref 14–54)
AST: 21 U/L (ref 15–41)
Albumin: 4.3 g/dL (ref 3.5–5.0)
Alkaline Phosphatase: 79 U/L (ref 38–126)
Anion gap: 11 (ref 5–15)
BUN: 13 mg/dL (ref 6–20)
CHLORIDE: 105 mmol/L (ref 101–111)
CO2: 23 mmol/L (ref 22–32)
Calcium: 9.5 mg/dL (ref 8.9–10.3)
Creatinine, Ser: 0.84 mg/dL (ref 0.44–1.00)
Glucose, Bld: 108 mg/dL — ABNORMAL HIGH (ref 65–99)
POTASSIUM: 3.5 mmol/L (ref 3.5–5.1)
SODIUM: 139 mmol/L (ref 135–145)
Total Bilirubin: 0.4 mg/dL (ref 0.3–1.2)
Total Protein: 7.5 g/dL (ref 6.5–8.1)

## 2017-04-15 LAB — URINALYSIS, COMPLETE (UACMP) WITH MICROSCOPIC
Bilirubin Urine: NEGATIVE
Glucose, UA: NEGATIVE mg/dL
Hgb urine dipstick: NEGATIVE
Ketones, ur: NEGATIVE mg/dL
Leukocytes, UA: NEGATIVE
Nitrite: NEGATIVE
Protein, ur: NEGATIVE mg/dL
Specific Gravity, Urine: 1.017 (ref 1.005–1.030)
pH: 5 (ref 5.0–8.0)

## 2017-04-15 LAB — LIPASE, BLOOD: Lipase: 37 U/L (ref 11–51)

## 2017-04-15 LAB — POCT PREGNANCY, URINE: PREG TEST UR: NEGATIVE

## 2017-04-15 MED ORDER — ONDANSETRON 4 MG PO TBDP: ORAL_TABLET | ORAL | 0 refills | Status: DC

## 2017-04-15 MED ORDER — ONDANSETRON 4 MG PO TBDP
4.0000 mg | ORAL_TABLET | Freq: Once | ORAL | Status: AC
  Administered 2017-04-15: 4 mg via ORAL
  Filled 2017-04-15: qty 1

## 2017-04-15 MED ORDER — DICYCLOMINE HCL 10 MG PO CAPS: 10.0000 mg | ORAL_CAPSULE | Freq: Four times a day (QID) | ORAL | 0 refills | Status: DC

## 2017-04-15 MED ORDER — DICYCLOMINE HCL 10 MG PO CAPS
10.0000 mg | ORAL_CAPSULE | Freq: Once | ORAL | Status: AC
  Administered 2017-04-15: 10 mg via ORAL
  Filled 2017-04-15: qty 1

## 2017-04-15 NOTE — ED Notes (Signed)
Reviewed discharge instructions, follow-up care, and prescriptions with patient. Patient verbalized understanding of all information reviewed. Patient stable, with no distress noted at this time.    

## 2017-04-15 NOTE — Discharge Instructions (Signed)

## 2017-04-15 NOTE — ED Provider Notes (Signed)
[facility] Emergency Department Provider Note  ____________________________________________   First MD Initiated Contact with Patient 04/15/17 0407     (approximate)  I have reviewed the triage vital signs and the nursing notes.   HISTORY  Chief Complaint Abdominal Pain    HPI Alexandra Solis is a 21 y.o. female with medical history as listed below who presents for evaluation of right-sided abdominal pain that feels similar to pain she had earlier this year.  She also reports some nausea and some vomiting.  Symptoms have been present for a couple of weeks intermittently and nothing in particular makes them better nor worse.  She reports that she had similar symptoms about 7 or 8 months ago and had an extensive evaluation in the emergency department which did not reveal a cause, and then she followed up with Dr. Servando SnareWohl with gastroenterology and had a nuclear medicine study that biliary issues (I verified all this in the computer and it is correct).  She reports that her symptoms went away into the last few weeks when they have come back.  She denies fever/chills, chest pain, shortness of breath, dysuria, and pelvic issues.  She reports having PCO S and irregular periods at baseline.  Her abdominal pain, however, is in her upper right side rather than being pelvic.  Past Medical History:  Diagnosis Date  . Bipolar 1 disorder (HCC)   . Bipolar 1 disorder (HCC)   . Hypertension   . Seizures (HCC)   . Thyroid disease    hypo    Patient Active Problem List   Diagnosis Date Noted  . Disease of thyroid gland 09/15/2016  . Hypertension 09/15/2016  . 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 Essential (primary) hypertension 04/21/2015  . Transient alteration of awareness 12/15/2014  . Seizure (HCC) 08/25/2014  . Chest pain 05/11/2014  . Anxiety 02/27/2014  . Depression 02/27/2014  . Involuntary movements 02/27/2014  . Panic attack 02/27/2014  . Syncope and  collapse 02/27/2014  . Gastro-esophageal reflux disease without esophagitis 10/08/2012  . GERD (gastroesophageal reflux disease) 10/08/2012  . Multiple thyroid nodules 10/08/2012  . 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 TONSILLECTOMY      Prior to Admission medications   Medication Sig Start Date End Date Taking? Authorizing Provider  Cariprazine HCl (VRAYLAR) 4.5 MG CAPS Take 1 capsule by mouth daily.    [provider]  cefdinir (OMNICEF) 300 MG capsule Take 1 capsule (300 mg total) by mouth 2 (two) times daily. 08/23/16   Sharman CheekStafford, Phillip, MD  cyclobenzaprine (FLEXERIL) 10 MG tablet Take 1 tablet (10 mg total) by mouth 3 (three) times daily as needed for muscle spasms. 02/20/17   Little, Traci M, PA-C  dicyclomine (BENTYL) 10 MG capsule Take 1 capsule (10 mg total) by mouth 4 (four) times daily for 14 days. 04/15/17 04/29/17  Loleta RoseForbach, Aveah Castell, MD  HYDROcodone-acetaminophen (NORCO) 5-325 MG tablet Take 1 tablet by mouth every 4 (four) hours as needed for moderate pain. 08/18/16   Willy Eddyobinson, Patrick, MD  Ibuprofen 200 MG CAPS Take 800 mg by mouth. 10/14/14   [provider]  lamoTRIgine (LAMICTAL) 100 MG tablet Take 100 mg by mouth at bedtime.    [provider]  levETIRAcetam (KEPPRA) 500 MG tablet Take 1 tablet (500 mg total) by mouth 2 (two) times daily. Patient not taking: Reported on 09/15/2016 05/14/16   Alford HighlandWieting, Richard, MD  LORazepam (ATIVAN) 0.5 MG tablet Take  0.5 mg by mouth.    [provider]  naproxen (NAPROSYN) 500 MG tablet Take 1 tablet (500 mg total) by mouth 2 (two) times daily with a meal. Patient not taking: Reported on 09/15/2016 08/23/16   Sharman CheekStafford, Phillip, MD  OLANZapine (ZYPREXA) 20 MG tablet Take 20 mg by mouth daily.    [provider]  ondansetron (ZOFRAN ODT) 4 MG disintegrating tablet Allow 1-2 tablets to dissolve in your mouth every 8 hours as needed for nausea/vomiting  04/15/17   Loleta RoseForbach, Emerick Weatherly, MD  predniSONE (STERAPRED UNI-PAK 21 TAB) 10 MG (21) TBPK tablet Take 6 tablets on day 1. Take 5 tablets on day 2. Take 4 tablets on day 3. Take 3 tablets on day 4. Take 2 tablets on day 5. Take 1 tablets on day 6. 02/20/17   Little, Traci M, PA-C  promethazine (PHENERGAN) 12.5 MG tablet Take 1 tablet (12.5 mg total) by mouth every 6 (six) hours as needed for nausea or vomiting. Patient not taking: Reported on 09/15/2016 08/18/16   Willy Eddyobinson, Patrick, MD  sertraline (ZOLOFT) 100 MG tablet Take 100 mg by mouth.    [provider]  traZODone (DESYREL) 150 MG tablet Take 150 mg by mouth.    [provider]  zolpidem (AMBIEN) 10 MG tablet Take by mouth.    [provider]    Allergies Seroquel [quetiapine fumarate] and Quetiapine  No family history on file.  Social History Social History   Tobacco Use  . Smoking status: Current Some Day Smoker    Types: Cigarettes  . Smokeless tobacco: Never Used  Substance Use Topics  . Alcohol use: Yes  . Drug use: Yes    Types: Marijuana    Review of Systems Constitutional: No fever/chills Cardiovascular: Denies chest pain. Respiratory: Denies shortness of breath. Gastrointestinal: Right-sided abdominal pain with nausea/vomiting over the last couple of weeks. Genitourinary: Negative for dysuria. Musculoskeletal: Negative for neck pain.  Negative for back pain. Integumentary: Negative for rash. Neurological: Negative for headaches, focal weakness or numbness.   ____________________________________________   PHYSICAL EXAM:  VITAL SIGNS: ED Triage Vitals  Enc Vitals Group     BP 04/14/17 2349 (!) 150/95     Pulse Rate 04/14/17 2349 93     Resp 04/14/17 2349 16     Temp 04/14/17 2349 99.1 F (37.3 C)     Temp Source 04/14/17 2349 Oral     SpO2 04/14/17 2349 97 %     Weight --      Height 04/14/17 2350 1.829 m (6')     Head Circumference --      Peak Flow --      Pain Score 04/14/17  2349 7     Pain Loc --      Pain Edu? --      Excl. in GC? --     Constitutional: Alert and oriented. Well appearing and in no acute distress. Eyes: Conjunctivae are normal.  Head: Atraumatic. Nose: No congestion/rhinnorhea. Mouth/Throat: Mucous membranes are moist. Neck: No stridor.  No meningeal signs.   Cardiovascular: Normal rate, regular rhythm. Good peripheral circulation. Grossly normal heart sounds. Respiratory: Normal respiratory effort.  No retractions. Lungs CTAB. Gastrointestinal: Soft and nontender. No distention.  Genitourinary: Deferred, no indication for pelvic given nature of pain Musculoskeletal: No lower extremity tenderness nor edema. No gross deformities of extremities. Neurologic:  Normal speech and language. No gross focal neurologic deficits are appreciated.  Skin:  Skin is warm, dry and intact. No rash  noted. Psychiatric: Mood and affect are normal. Speech and behavior are normal.  ____________________________________________   LABS (all labs ordered are listed, but only abnormal results are displayed)  Labs Reviewed  COMPREHENSIVE METABOLIC PANEL - Abnormal; Notable for the following components:      Result Value   Glucose, Bld 108 (*)    All other components within normal limits  URINALYSIS, COMPLETE (UACMP) WITH MICROSCOPIC - Abnormal; Notable for the following components:   Color, Urine YELLOW (*)    APPearance HAZY (*)    Bacteria, UA RARE (*)    Squamous Epithelial / LPF 0-5 (*)    All other components within normal limits  CBC  LIPASE, BLOOD  POC URINE PREG, ED  POCT PREGNANCY, URINE   ____________________________________________  EKG  None - EKG not ordered by ED physician ____________________________________________  RADIOLOGY   No results found.  ____________________________________________   PROCEDURES  Critical Care performed: No   Procedure(s) performed:    Procedures   ____________________________________________   INITIAL IMPRESSION / ASSESSMENT AND PLAN / ED COURSE  As part of my medical decision making, I reviewed the following data within the electronic MEDICAL RECORD NUMBER Nursing notes reviewed and incorporated, Labs reviewed , Old chart reviewed and Notes from prior ED visits    Differential diagnosis includes, but is not limited to, biliary disease (biliary colic, acute cholecystitis, cholangitis, choledocholithiasis, etc), intrathoracic causes for epigastric abdominal pain including ACS, gastritis, duodenitis, pancreatitis, small bowel or large bowel obstruction, abdominal aortic aneurysm, hernia, and gastritis.  However the patient's symptoms are not present at this time, she feels well, has stable vitals, a normal lab workup, and has no tenderness to palpation of her abdomen.  I reviewed her medical record extensively and she had a very thorough workup both in the emergency department and as an outpatient with a nuclear medicine biliary study.  I suspect this is a flare of her chronic issues, possibly IBS.  She has taken Bentyl in the past and said that that did seem to help so I will prescribe some more Bentyl as well as Zofran.  I encouraged her to follow-up again with Dr. Servando Snare who is her GI doctor.  I gave my usual and customary.  She understands and agrees with the plan.     ____________________________________________  FINAL CLINICAL IMPRESSION(S) / ED DIAGNOSES  Final diagnoses:  Right sided abdominal pain  Nausea and vomiting, intractability of vomiting not specified, unspecified vomiting type     MEDICATIONS GIVEN DURING THIS VISIT:  Medications  ondansetron (ZOFRAN-ODT) disintegrating tablet 4 mg (not administered)  dicyclomine (BENTYL) capsule 10 mg (not administered)     ED Discharge Orders        Ordered    ondansetron (ZOFRAN ODT) 4 MG disintegrating tablet     04/15/17 0442    dicyclomine (BENTYL) 10 MG  capsule  4 times daily     04/15/17 0442       Note:  This document was prepared using Dragon voice recognition software and may include unintentional dictation errors.    Loleta Rose, MD 04/15/17 [phone]

## 2017-04-27 ENCOUNTER — Emergency Department (HOSPITAL_COMMUNITY)
Admission: EM | Admit: 2017-04-27 | Discharge: 2017-04-27 | Disposition: A | Discharge status: Home / Self Care | Payer: Self-pay | Attending: Emergency Medicine | Admitting: Emergency Medicine

## 2017-04-27 ENCOUNTER — Other Ambulatory Visit: Payer: Self-pay

## 2017-04-27 ENCOUNTER — Emergency Department (HOSPITAL_COMMUNITY): Payer: Self-pay

## 2017-04-27 ENCOUNTER — Encounter (HOSPITAL_COMMUNITY): Payer: Self-pay | Admitting: *Deleted

## 2017-04-27 DIAGNOSIS — F17210 Nicotine dependence, cigarettes, uncomplicated: Secondary | ICD-10-CM | POA: Insufficient documentation

## 2017-04-27 DIAGNOSIS — I10 Essential (primary) hypertension: Secondary | ICD-10-CM | POA: Insufficient documentation

## 2017-04-27 DIAGNOSIS — F1210 Cannabis abuse, uncomplicated: Secondary | ICD-10-CM | POA: Insufficient documentation

## 2017-04-27 DIAGNOSIS — E039 Hypothyroidism, unspecified: Secondary | ICD-10-CM | POA: Insufficient documentation

## 2017-04-27 DIAGNOSIS — Z79899 Other long term (current) drug therapy: Secondary | ICD-10-CM | POA: Insufficient documentation

## 2017-04-27 DIAGNOSIS — R079 Chest pain, unspecified: Secondary | ICD-10-CM | POA: Insufficient documentation

## 2017-04-27 DIAGNOSIS — R110 Nausea: Secondary | ICD-10-CM | POA: Insufficient documentation

## 2017-04-27 LAB — BASIC METABOLIC PANEL
ANION GAP: 8 (ref 5–15)
BUN: 9 mg/dL (ref 6–20)
CHLORIDE: 107 mmol/L (ref 101–111)
CO2: 23 mmol/L (ref 22–32)
Calcium: 9 mg/dL (ref 8.9–10.3)
Creatinine, Ser: 0.78 mg/dL (ref 0.44–1.00)
GFR calc non Af Amer: >60 mL/min (ref >60)
Glucose, Bld: 86 mg/dL (ref 65–99)
POTASSIUM: 4.3 mmol/L (ref 3.5–5.1)
SODIUM: 138 mmol/L (ref 135–145)

## 2017-04-27 LAB — I-STAT BETA HCG BLOOD, ED (MC, WL, AP ONLY): I-stat hCG, quantitative: <5 m[IU]/mL (ref <5)

## 2017-04-27 LAB — I-STAT TROPONIN, ED: Troponin i, poc: 0 ng/mL (ref 0.00–0.08)

## 2017-04-27 LAB — CBC
HEMATOCRIT: 44.8 % (ref 36.0–46.0)
HEMOGLOBIN: 14.5 g/dL (ref 12.0–15.0)
MCH: 30.3 pg (ref 26.0–34.0)
MCHC: 32.4 g/dL (ref 30.0–36.0)
MCV: 93.7 fL (ref 78.0–100.0)
PLATELETS: 313 10*3/uL (ref 150–400)
RBC: 4.78 MIL/uL (ref 3.87–5.11)
RDW: 13.3 % (ref 11.5–15.5)
WBC: 7.4 10*3/uL (ref 4.0–10.5)

## 2017-04-27 LAB — D-DIMER, QUANTITATIVE (NOT AT ARMC): D DIMER QUANT: 0.29 ug{FEU}/mL (ref 0.00–0.50)

## 2017-04-27 MED ORDER — GI COCKTAIL ~~LOC~~
30.0000 mL | Freq: Once | ORAL | Status: AC
  Administered 2017-04-27: 30 mL via ORAL
  Filled 2017-04-27: qty 30

## 2017-04-27 MED ORDER — NAPROXEN 500 MG PO TABS: 500.0000 mg | ORAL_TABLET | Freq: Two times a day (BID) | ORAL | 0 refills | Status: DC

## 2017-04-27 NOTE — ED Provider Notes (Signed)
[facility] Provider Note   CSN: [id number] Arrival date & time: 04/27/17  1549     History   Chief Complaint Chief Complaint  Patient presents with  . Chest Pain  . Shortness of Breath    HPI Alexandra Burrowshley S Griffie is a 21 y.o. female.  HPI Alexandra Burrowshley S Rumer is a 21 y.o. female with history of bipolar disorder, hypertension, seizures, thyroid disorder, presents to emergency department complaining of chest pain.  Patient states that her pain started early this morning.  The center of the chest.  It does not radiate.  She describes pain as sharp.  Reports associated nausea.  No vomiting.  Denies any shortness of breath.  No cough.  Denies any numbness or weakness in extremities.  Denies headache.  She states she did not try any medications for pain.  No history of similar symptoms in the past.  No history of cardiac or lung problems.  She has not had any recent travel or surgeries.  Past Medical History:  Diagnosis Date  . Bipolar 1 disorder (HCC)   . Bipolar 1 disorder (HCC)   . Hypertension   . Seizures (HCC)   . Thyroid disease    hypo    Patient Active Problem List   Diagnosis Date Noted  . Disease of thyroid gland 09/15/2016  . Hypertension 09/15/2016  . 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 Essential (primary) hypertension 04/21/2015  . Transient alteration of awareness 12/15/2014  . Seizure (HCC) 08/25/2014  . Chest pain 05/11/2014  . Anxiety 02/27/2014  . Depression 02/27/2014  . Involuntary movements 02/27/2014  . Panic attack 02/27/2014  . Syncope and collapse 02/27/2014  . Gastro-esophageal reflux disease without esophagitis 10/08/2012  . GERD (gastroesophageal reflux disease) 10/08/2012  . Multiple thyroid nodules 10/08/2012  . 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 TONSILLECTOMY      OB History    Gravida Para Term Preterm AB Living   0 0 0 0 0 0   SAB  TAB Ectopic Multiple Live Births   0 0 0 0         Home Medications    Prior to Admission medications   Medication Sig Start Date End Date Taking? Authorizing Provider  Cariprazine HCl (VRAYLAR) 4.5 MG CAPS Take 1 capsule by mouth daily.    [provider]  cefdinir (OMNICEF) 300 MG capsule Take 1 capsule (300 mg total) by mouth 2 (two) times daily. 08/23/16   Sharman CheekStafford, Phillip, MD  cyclobenzaprine (FLEXERIL) 10 MG tablet Take 1 tablet (10 mg total) by mouth 3 (three) times daily as needed for muscle spasms. 02/20/17   Little, Traci M, PA-C  dicyclomine (BENTYL) 10 MG capsule Take 1 capsule (10 mg total) by mouth 4 (four) times daily for 14 days. 04/15/17 04/29/17  Loleta RoseForbach, Cory, MD  HYDROcodone-acetaminophen (NORCO) 5-325 MG tablet Take 1 tablet by mouth every 4 (four) hours as needed for moderate pain. 08/18/16   Willy Eddyobinson, Patrick, MD  Ibuprofen 200 MG CAPS Take 800 mg by mouth. 10/14/14   [provider]  lamoTRIgine (LAMICTAL) 100 MG tablet Take 100 mg by mouth at bedtime.    [provider]  levETIRAcetam (KEPPRA) 500 MG tablet Take 1 tablet (500 mg total) by mouth 2 (two) times daily. Patient not taking: Reported on 09/15/2016 05/14/16   Alford HighlandWieting, Richard, MD  LORazepam (ATIVAN) 0.5 MG tablet Take 0.5 mg by mouth.  [provider]  naproxen (NAPROSYN) 500 MG tablet Take 1 tablet (500 mg total) by mouth 2 (two) times daily with a meal. Patient not taking: Reported on 09/15/2016 08/23/16   Sharman CheekStafford, Phillip, MD  OLANZapine (ZYPREXA) 20 MG tablet Take 20 mg by mouth daily.    [provider]  ondansetron (ZOFRAN ODT) 4 MG disintegrating tablet Allow 1-2 tablets to dissolve in your mouth every 8 hours as needed for nausea/vomiting 04/15/17   Loleta RoseForbach, Cory, MD  predniSONE (STERAPRED UNI-PAK 21 TAB) 10 MG (21) TBPK tablet Take 6 tablets on day 1. Take 5 tablets on day 2. Take 4 tablets on day 3. Take 3 tablets on day 4. Take 2 tablets on day 5. Take 1  tablets on day 6. 02/20/17   Little, Traci M, PA-C  promethazine (PHENERGAN) 12.5 MG tablet Take 1 tablet (12.5 mg total) by mouth every 6 (six) hours as needed for nausea or vomiting. Patient not taking: Reported on 09/15/2016 08/18/16   Willy Eddyobinson, Patrick, MD  sertraline (ZOLOFT) 100 MG tablet Take 100 mg by mouth.    [provider]  traZODone (DESYREL) 150 MG tablet Take 150 mg by mouth.    [provider]  zolpidem (AMBIEN) 10 MG tablet Take by mouth.    [provider]    Family History History reviewed. No pertinent family history.  Social History Social History   Tobacco Use  . Smoking status: Current Some Day Smoker    Types: Cigarettes  . Smokeless tobacco: Never Used  Substance Use Topics  . Alcohol use: Yes  . Drug use: Yes    Types: Marijuana     Allergies   Seroquel [quetiapine fumarate] and Quetiapine   Review of Systems Review of Systems  Constitutional: Negative for chills and fever.  Respiratory: Positive for chest tightness. Negative for cough and shortness of breath.   Cardiovascular: Positive for chest pain. Negative for palpitations and leg swelling.  Gastrointestinal: Negative for abdominal pain, diarrhea, nausea and vomiting.  Genitourinary: Negative for dysuria, flank pain, pelvic pain, vaginal bleeding, vaginal discharge and vaginal pain.  Musculoskeletal: Negative for arthralgias, myalgias, neck pain and neck stiffness.  Skin: Negative for rash.  Neurological: Negative for dizziness, weakness and headaches.  All other systems reviewed and are negative.    Physical Exam Updated Vital Signs BP (!) 148/112 (BP Location: Left Arm)   Pulse 86   Temp 98.7 F (37.1 C) (Oral)   Resp 16   Ht 6' (1.829 m)   Wt 95.3 kg (210 lb)   LMP 04/27/2017   SpO2 96%   BMI 28.48 kg/m   Physical Exam  Constitutional: She appears well-developed and well-nourished. No distress.  HENT:  Head: Normocephalic.  Eyes: Conjunctivae are  normal.  Neck: Neck supple.  Cardiovascular: Normal rate, regular rhythm and normal heart sounds.  Pulmonary/Chest: Effort normal and breath sounds normal. No respiratory distress. She has no wheezes. She has no rales.  Abdominal: Soft. Bowel sounds are normal. She exhibits no distension. There is no tenderness. There is no rebound.  Musculoskeletal: She exhibits no edema.  No lower extremity swelling, no calf tenderness.  Dorsal pedal pulses intact and equal bilaterally  Neurological: She is alert.  Skin: Skin is warm and dry.  Psychiatric: She has a normal mood and affect. Her behavior is normal.  Nursing note and vitals reviewed.    ED Treatments / Results  Labs (all labs ordered are listed, but only abnormal results are displayed) Labs Reviewed  BASIC METABOLIC PANEL  CBC  D-DIMER, QUANTITATIVE (NOT AT [facility])  I-STAT TROPONIN, ED  I-STAT BETA HCG BLOOD, ED ([facility], [facility], [facility] ONLY)    EKG  EKG Interpretation  Date/Time:  Thursday April 27 2017 15:56:12 EST Ventricular Rate:  78 PR Interval:  132 QRS Duration: 88 QT Interval:  378 QTC Calculation: 430 R Axis:   46 Text Interpretation:  Normal sinus rhythm Normal ECG Confirmed by Donnetta Hutching ([id number]) on 04/27/2017 7:39:36 PM       Radiology Dg Chest 2 View  Result Date: 04/27/2017 CLINICAL DATA:  Left-sided chest pain with shortness of breath EXAM: CHEST  2 VIEW COMPARISON:  05/13/2016 FINDINGS: The heart size and mediastinal contours are within normal limits. Both lungs are clear. The visualized skeletal structures are unremarkable. IMPRESSION: No active cardiopulmonary disease. Electronically Signed   By: Jasmine Pang M.D.   On: 04/27/2017 16:21    Procedures Procedures (including critical care time)  Medications Ordered in ED Medications  gi cocktail (Maalox,Lidocaine,Donnatal) (30 mLs Oral Given 04/27/17 1923)     Initial Impression / Assessment and Plan / ED Course  I have reviewed the triage vital signs and  the nursing notes.  Pertinent labs & imaging results that were available during my care of the patient were reviewed by me and considered in my medical decision making (see chart for details).     Patient in emergency department with sharp chest pain that is worsened with some deep breathing, otherwise no associated symptoms.  No history of the same.  She is low risk for ACS.  Vital signs are normal other than elevated blood pressure which she has history of.  Labs obtained at triage, all normal including CBC, basic metabolic panel, pregnancy test is negative, negative troponin.  Chest x-ray is normal as well.  EKG normal.  Will get a d-dimer.  9:22 PM D-dimer is negative.  In setting of normal vital signs and a negative d-dimer, highly believe that this is a PE.  Again doubt ACS given no major risk factors, heart score of 1, normal troponin.  Question gastritis, patient was given GI cocktail but she states that her pain did not improve.  Considered pericarditis or pleurisy which will be treated with NSAIDs.  I will have a follow-up with her family doctor.  Return precautions discussed.  Stable for discharge home.  Vitals:   04/27/17 1559 04/27/17 1834 04/27/17 1933  BP: (!) 137/98 (!) 148/112   Pulse: 87 86   Resp: 19 16   Temp: 98.7 F (37.1 C)    TempSrc: Oral    SpO2: 100% 96%   Weight:   95.3 kg (210 lb)  Height:   6' (1.829 m)     Final Clinical Impressions(s) / ED Diagnoses   Final diagnoses:  Chest pain, unspecified type    ED Discharge Orders        Ordered    naproxen (NAPROSYN) 500 MG tablet  2 times daily     04/27/17 2120       Jaynie Crumble, Cordelia Poche 04/27/17 2124    Donnetta Hutching, MD 04/29/17 1315

## 2017-04-27 NOTE — ED Triage Notes (Signed)
Pt reports onset this am of squeezing chest pain and sob. ekg done at triage and no acute distress is noted. Reports HTN but unable to take meds recently. bp 137/98 at triage.

## 2017-04-27 NOTE — Discharge Instructions (Signed)
Take Naprosyn as prescribed for pain and inflammation.  Avoid any spicy or tomato based foods.  Follow-up with a family doctor or urgent care.  Return if worsening symptoms.

## 2017-07-10 ENCOUNTER — Encounter: Payer: Self-pay | Admitting: Family Medicine

## 2017-07-10 ENCOUNTER — Ambulatory Visit: Payer: Self-pay | Admitting: Family Medicine

## 2017-07-10 VITALS — BP 120/70 | HR 100 | Temp 98.6°F | Resp 16 | Ht 69.5 in | Wt 237.4 lb

## 2017-07-10 DIAGNOSIS — E042 Nontoxic multinodular goiter: Secondary | ICD-10-CM

## 2017-07-10 DIAGNOSIS — F319 Bipolar disorder, unspecified: Secondary | ICD-10-CM

## 2017-07-10 DIAGNOSIS — F410 Panic disorder [episodic paroxysmal anxiety] without agoraphobia: Secondary | ICD-10-CM

## 2017-07-10 DIAGNOSIS — Z7689 Persons encountering health services in other specified circumstances: Secondary | ICD-10-CM

## 2017-07-10 DIAGNOSIS — I10 Essential (primary) hypertension: Secondary | ICD-10-CM

## 2017-07-10 DIAGNOSIS — R259 Unspecified abnormal involuntary movements: Secondary | ICD-10-CM

## 2017-07-10 DIAGNOSIS — R569 Unspecified convulsions: Secondary | ICD-10-CM

## 2017-07-10 MED ORDER — LISINOPRIL 10 MG PO TABS: 10.0000 mg | ORAL_TABLET | Freq: Every day | ORAL | 1 refills | Status: DC

## 2017-07-10 NOTE — Patient Instructions (Addendum)
Here are some resources to help you if you feel you are in a mental health crisis:  National Suicide Prevention Lifeline - Call (306)010-07911-912-680-2102  for help - Website with more resources: ARanked.fihttps://suicidepreventionlifeline.org/  [facility] - Call (571) [phone] for help. - Mobile Crisis Program available 24 hours a day, 365 days a year. - Available for anyone of any age in [place] & [place] counties.  [facility] - Address: [address], [place] - Telephone: [phone] - Hours of Operation: Sunday - Saturday - 8:00 a.m. - 8:00 p.m. - Medicaid, Medicare (Government Issued Only), BCBS, and [employer] - Crisis Management, Outpatient Individual & Group Therapy, Psychiatrists on-site to provide medication management, In-Home Psychiatric Care, and Peer Support Care.  [facility] - Call [phone] for help. - Mobile Crisis Program available 24 hours a day, 365 days a year. - Available for anyone of any age in [place] & [place]    Bipolar 1 Disorder Bipolar 1 disorder is a mental health disorder in which a person has episodes of emotional highs (mania), and may also have episodes of emotional lows (depression) in addition to highs. Bipolar 1 disorder is different from other bipolar disorders because it involves extreme manic episodes. These episodes last at least one week or involve symptoms that are so severe that hospitalization is needed to keep the person safe. What increases the risk? The cause of this condition is not known. However, certain factors make you more likely to have bipolar disorder, such as:  Having a family member with the disorder.  An imbalance of certain chemicals in the brain (neurotransmitters).  Stress, such as illness, financial problems, or a death.  Certain conditions that affect the brain or spinal cord (neurologic conditions).  Brain injury (trauma).  Having  another mental health disorder, such as: ? Obsessive compulsive disorder. ? Schizophrenia.  What are the signs or symptoms? Symptoms of mania include:  Very high self-esteem or self-confidence.  Decreased need for sleep.  Unusual talkativeness or feeling a need to keep talking. Speech may be very fast. It may seem like you cannot stop talking.  Racing thoughts or constant talking, with quick shifts between topics that may or may not be related (flight of ideas).  Decreased ability to focus or concentrate.  Increased purposeful activity, such as work, studies, or social activity.  Increased nonproductive activity. This could be pacing, squirming and fidgeting, or finger and toe tapping.  Impulsive behavior and poor judgment. This may result in high-risk activities, such as having unprotected sex or spending a lot of money.  Symptoms of depression include:  Feeling sad, hopeless, or helpless.  Frequent or uncontrollable crying.  Lack of feeling or caring about anything.  Sleeping too much.  Moving more slowly than usual.  Not being able to enjoy things you used to enjoy.  Wanting to be alone all the time.  Feeling guilty or worthless.  Lack of energy or motivation.  Trouble concentrating or remembering.  Trouble making decisions.  Increased appetite.  Thoughts of death, or the desire to harm yourself.  Sometimes, you may have a mixed mood. This means having symptoms of depression and mania. Stress can make symptoms worse. How is this diagnosed? To diagnose bipolar disorder, your health care provider may ask about your:  Emotional episodes.  Medical history.  Alcohol and drug use. This includes prescription medicines. Certain medical conditions and substances can cause symptoms that seem like bipolar disorder (secondary bipolar disorder).  How is this treated? Bipolar disorder is a long-term (chronic) illness. It is best controlled with ongoing (continuous)  treatment rather than treatment only when symptoms occur. Treatment may include:  Medicine. Medicine can be prescribed by a provider who specializes in treating mental disorders (psychiatrist). ? Medicines called mood stabilizers are usually prescribed. ? If symptoms occur even while taking a mood stabilizer, other medicines may be added.  Psychotherapy. Some forms of talk therapy, such as cognitive-behavioral therapy (CBT), can provide support, education, and guidance.  Coping methods, such as journaling or relaxation exercises. These may include: ? Yoga. ? Meditation. ? Deep breathing.  Lifestyle changes, such as: ? Limiting alcohol and drug use. ? Exercising regularly. ? Getting plenty of sleep. ? Making healthy eating choices.  A combination of medicine, talk therapy, and coping methods is best. A procedure in which electricity is applied to the brain through the scalp (electroconvulsive therapy) may be used in cases of severe mania when medicine and psychotherapy work too slowly or do not work. Follow these instructions at home: Activity   Return to your normal activities as told by your health care provider.  Find activities that you enjoy, and make time to do them.  Exercise regularly as told by your health care provider. Lifestyle  Limit alcohol intake to no more than 1 drink a day for nonpregnant women and 2 drinks a day for men. One drink equals 12 oz of beer, 5 oz of wine, or 1 oz of hard liquor.  Follow a set schedule for eating and sleeping.  Eat a balanced diet that includes fresh fruits and vegetables, whole grains, low-fat dairy, and lean meat.  Get 7-8 hours of sleep each night. General instructions  Take over-the-counter and prescription medicines only as told by your health care provider.  Think about joining a support group. Your health care provider may be able to recommend a support group.  Talk with your family and loved ones about your treatment  goals and how they can help.  Keep all follow-up visits as told by your health care provider. This is important. Where to find more information: For more information about bipolar disorder, visit the following websites:  [employer] on Mental Illness: www.nami.org  [employer] of Mental Health: http://www.maynard.net/  Contact a health care provider if:  Your symptoms get worse.  You have side effects from your medicine, and they get worse.  You have trouble sleeping.  You have trouble doing daily activities.  You feel unsafe in your surroundings.  You are dealing with substance abuse. Get help right away if:  You have new symptoms.  You have thoughts about harming yourself.  You self-harm. This information is not intended to replace advice given to you by your health care provider. Make sure you discuss any questions you have with your health care provider. Document Released: 08/01/2000 Document Revised: 12/20/2015 Document Reviewed: 12/24/2015 Elsevier Interactive Patient Education  [employer].
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
## 2017-07-11 LAB — COMPLETE METABOLIC PANEL WITH GFR
AG Ratio: 1.6 (calc) (ref 1.0–2.5)
ALT: 11 U/L (ref 6–29)
AST: 11 U/L (ref 10–30)
Albumin: 4.2 g/dL (ref 3.6–5.1)
Alkaline phosphatase (APISO): 88 U/L (ref 33–115)
BUN: 10 mg/dL (ref 7–25)
CALCIUM: 9.3 mg/dL (ref 8.6–10.2)
CO2: 29 mmol/L (ref 20–32)
CREATININE: 0.91 mg/dL (ref 0.50–1.10)
Chloride: 106 mmol/L (ref 98–110)
GFR, EST NON AFRICAN AMERICAN: 90 mL/min/{1.73_m2} (ref >=60)
GFR, Est African American: 105 mL/min/{1.73_m2} (ref >=60)
Globulin: 2.7 g/dL (calc) (ref 1.9–3.7)
Glucose, Bld: 87 mg/dL (ref 65–99)
Potassium: 4.1 mmol/L (ref 3.5–5.3)
Sodium: 142 mmol/L (ref 135–146)
TOTAL PROTEIN: 6.9 g/dL (ref 6.1–8.1)
Total Bilirubin: 0.4 mg/dL (ref 0.2–1.2)

## 2017-07-11 LAB — TSH: TSH: 0.73 m[IU]/L

## 2017-07-18 ENCOUNTER — Other Ambulatory Visit: Payer: Self-pay | Admitting: Family Medicine

## 2017-07-18 DIAGNOSIS — E042 Nontoxic multinodular goiter: Secondary | ICD-10-CM

## 2017-07-18 NOTE — Progress Notes (Signed)
Received message back from Dr. Ophelia Charterlark's office with ENT that they decline referral for patient's multinodular goiter. Referral placed to Endocrinology for further management. The most recent notes from specialty appear to be from Dr[facility] Allena [facility]Patel [facility] on 12/09/14, and he recommended she transition to adult specialty.

## 2017-07-23 DIAGNOSIS — I10 Essential (primary) hypertension: Secondary | ICD-10-CM | POA: Insufficient documentation

## 2017-07-23 DIAGNOSIS — Z87891 Personal history of nicotine dependence: Secondary | ICD-10-CM | POA: Insufficient documentation

## 2017-07-23 DIAGNOSIS — E039 Hypothyroidism, unspecified: Secondary | ICD-10-CM | POA: Insufficient documentation

## 2017-07-23 DIAGNOSIS — R0789 Other chest pain: Secondary | ICD-10-CM | POA: Insufficient documentation

## 2017-07-24 ENCOUNTER — Emergency Department: Payer: Self-pay

## 2017-07-24 ENCOUNTER — Emergency Department
Admission: EM | Admit: 2017-07-24 | Discharge: 2017-07-24 | Disposition: A | Discharge status: Home / Self Care | Payer: Self-pay | Attending: Emergency Medicine | Admitting: Emergency Medicine

## 2017-07-24 ENCOUNTER — Encounter: Payer: Self-pay | Admitting: Emergency Medicine

## 2017-07-24 ENCOUNTER — Other Ambulatory Visit: Payer: Self-pay

## 2017-07-24 DIAGNOSIS — R059 Cough, unspecified: Secondary | ICD-10-CM

## 2017-07-24 DIAGNOSIS — R05 Cough: Secondary | ICD-10-CM

## 2017-07-24 DIAGNOSIS — R0789 Other chest pain: Secondary | ICD-10-CM

## 2017-07-24 MED ORDER — BENZONATATE 100 MG PO CAPS: 100.0000 mg | ORAL_CAPSULE | Freq: Four times a day (QID) | ORAL | 0 refills | Status: AC | PRN

## 2017-07-24 NOTE — ED Notes (Signed)
Patient updated.

## 2017-07-24 NOTE — ED Notes (Signed)
Patient reports pain in chest worse with coughing. States when she coughs really hard she becomes lightheaded. NAD noted at this time.

## 2017-07-24 NOTE — ED Triage Notes (Signed)
Pt reports started about 5 days ago with a cough then developed shortness of breath, chest pain and today felt like she was going to pass out. Pt reports she recently took a job at [employer].

## 2017-07-24 NOTE — ED Provider Notes (Signed)
[facility] Emergency Department Provider Note  ____________________________________________   I have reviewed the triage vital signs and the nursing notes. Where available I have reviewed prior notes and, if possible and indicated, outside hospital notes.    HISTORY  Chief Complaint Cough; Chest Pain; and Shortness of Breath    HPI Alexandra Solis is a 22 y.o. female who presents today complaining of runny nose, cough, pain in her chest when she is coughing.  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  She says symptoms, however, for 5 days.  She does not actually endorse syncopal symptoms she states sometimes she coughs so hard she feels lightheaded however her cough is actually been getting better over the last couple days.  She states she does need a note for her work not only for today but for yesterday as well.  Patient states she is not having any chest pain unless she is in the physical active coughing.  She has no known risk factors for PE.  She states that there is no personal or family history of PE or DVT she denies taking any estrogens she has had no recent surgery, she states she has not had sex for over a year she declines to get a pregnancy test because he states that there is no point as she is "abstinent".  Patient states that she has had no recent travel.  No leg swelling etc.  Patient states that her symptoms are largely getting better and she thinks that she had a day or 2 at home to rest, she would feel even better.  She denies any abdominal pain, diarrhea vomiting, she has again very specific pain in the left chest which is worse with coughing, better when she does not cough, also worsens when she moves the wrong way or touches it.      Past Medical History:  Diagnosis Date  . Bipolar 1 disorder (HCC)   . Bipolar 1 disorder (HCC)   . Hypertension   . Seizures (HCC)   . Thyroid disease    hypo    Patient  Active Problem List   Diagnosis Date Noted  . Bipolar 1 disorder (HCC)   . Disease of thyroid gland 09/15/2016  . Hypertension 09/15/2016  . 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 Essential (primary) hypertension 04/21/2015  . Transient alteration of awareness 12/15/2014  . Seizure (HCC) 08/25/2014  . Chest pain 05/11/2014  . Anxiety 02/27/2014  . Depression 02/27/2014  . Involuntary movements 02/27/2014  . Panic attack 02/27/2014  . Syncope and collapse 02/27/2014  . Gastro-esophageal reflux disease without esophagitis 10/08/2012  . GERD (gastroesophageal reflux disease) 10/08/2012  . Multiple thyroid nodules 10/08/2012  . 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 TONSILLECTOMY      Prior to Admission medications   Medication Sig Start Date End Date Taking? Authorizing Provider  lisinopril (PRINIVIL,ZESTRIL) 10 MG tablet Take 1 tablet (10 mg total) by mouth daily. 07/10/17   Doren Custard, FNP    Allergies Seroquel [quetiapine fumarate] and Quetiapine  History reviewed. No pertinent family history.  Social History Social History   Tobacco Use  . Smoking status: Former Smoker    Types: Cigarettes  . Smokeless tobacco: Never Used  Substance Use Topics  . Alcohol use: No    Frequency: Never  . 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 ENT: No sore throat. No stiff neck no neck pain Cardiovascular: + chest pain. Respiratory: + shortness of breath. Gastrointestinal:   no vomiting.  No diarrhea.  No constipation. Genitourinary: Negative for dysuria. Musculoskeletal: Negative lower extremity swelling Skin: Negative for rash. Neurological: Negative for severe headaches, focal weakness or numbness.   ____________________________________________   PHYSICAL EXAM:  VITAL SIGNS: ED Triage Vitals [07/23/17 2355]  Enc Vitals Group      BP 134/86     Pulse Rate 72     Resp 18     Temp 98.5 F (36.9 C)     Temp Source Oral     SpO2 100 %     Weight 230 lb (104.3 kg)     Height 6' (1.829 m)     Head Circumference      Peak Flow      Pain Score 5     Pain Loc      Pain Edu?      Excl. in GC?     Constitutional: Alert and oriented. Well appearing and in no acute distress.  Using her cell phone Eyes: Conjunctivae are normal Head: Atraumatic HEENT: No congestion/rhinnorhea. Mucous membranes are moist.  Oropharynx non-erythematous Neck:   Nontender with no meningismus, no masses, no stridor Cardiovascular: Normal rate, regular rhythm. Grossly normal heart sounds.  Good peripheral circulation. Respiratory: Normal respiratory effort.  No retractions. Lungs CTAB. Chest: Female chaperone present, there is tenderness palpation left chest wall when I touch this area, in the axilla region underneath the left arm, around T4, patient has specific pain in that area.  There is no bruising there is no flail chest there is no crepitus, and I touch that area patient states "ouch that the pain right there". Abdominal: Soft and nontender. No distention. No guarding no rebound Back:  There is no focal tenderness or step off.  there is no midline tenderness there are no lesions noted. there is no CVA tenderness Musculoskeletal: No lower extremity tenderness, no upper extremity tenderness. No joint effusions, no DVT signs strong distal pulses no edema Neurologic:  Normal speech and language. No gross focal neurologic deficits are appreciated.  Skin:  Skin is warm, dry and intact. No rash noted. Psychiatric: Mood and affect are normal. Speech and behavior are normal.  ____________________________________________   LABS (all labs ordered are listed, but only abnormal results are displayed)  Labs Reviewed - No data to display  Pertinent labs  results that were available during my care of the patient were reviewed by me and considered  in my medical decision making (see chart for details). ____________________________________________  EKG  I personally interpreted any EKGs ordered by me or triage Sinus rhythm, rate 76 bpm, no acute ST elevation, no acute ST depression, normal axis, nonspecific ST flattening, no acute ischemic changes ____________________________________________  RADIOLOGY  Pertinent labs & imaging results that were available during my care of the patient were reviewed by me and considered in my medical decision making (see chart for details). If possible, patient and/or family made aware of any abnormal findings.  Dg Chest 2 View  Result Date: 07/24/2017 CLINICAL DATA:  Chest pain and shortness of breath. EXAM: CHEST - 2 VIEW COMPARISON:  04/27/2017 FINDINGS: The cardiomediastinal contours are normal. The lungs are clear. Pulmonary vasculature is normal. No consolidation, pleural effusion, or pneumothorax. No acute osseous abnormalities are seen. IMPRESSION: Unremarkable radiographs of the chest. Electronically Signed  By: Rubye Oaks M.D.   On: 07/24/2017 01:15   I Personally reviewed x-ray ____________________________________________    PROCEDURES  Procedure(s) performed: None  Procedures  Critical Care performed: None  ____________________________________________   INITIAL IMPRESSION / ASSESSMENT AND PLAN / ED COURSE  Pertinent labs & imaging results that were available during my care of the patient were reviewed by me and considered in my medical decision making (see chart for details).  Patient here with cough, reproducible chest wall pain, rhinorrhea, cough is occasionally productive chest x-ray is clear, lungs are clear, sats are reassuring, patient has had symptoms for 5 days and is actually improving.  This is all consistent with a resolving viral syndrome.  Patient could have had the flu she did have fevers initially, however she is outside the window for treatment and there is  no utility of checking.  Very low suspicion for PE.  Patient really has no risk factors.  She declines pregnancy test.  She is PERC negative.  At this time, there does not appear to be clinical evidence to support the diagnosis of pulmonary embolus, dissection, myocarditis, endocarditis, pericarditis, pericardial tamponade, acute coronary syndrome, pneumothorax, pneumonia, or any other acute intrathoracic pathology that will require admission or acute intervention. Nor is there evidence of any significant intra-abdominal pathology causing this discomfort.  We will discharge her with close outpatient follow-up, return precautions and supportive care advised.  Patient is requesting a work note which we will give as well    ____________________________________________   FINAL CLINICAL IMPRESSION(S) / ED DIAGNOSES  Final diagnoses:  None      This chart was dictated using voice recognition software.  Despite best efforts to proofread,  errors can occur which can change meaning.      Jeanmarie Plant, MD 07/24/17 [phone]

## 2017-07-25 ENCOUNTER — Telehealth: Payer: Self-pay | Admitting: Emergency Medicine

## 2017-07-25 NOTE — Telephone Encounter (Signed)
Copied from CRM [phone]. Topic: Inquiry >> Jul 19, 2017 10:53 AM Landry MellowFoltz, Melissa J wrote: Reason for CRM: [phone] Please call pt when labs are in

## 2017-07-25 NOTE — Telephone Encounter (Signed)
Pt is going to log into MyChart and read messages.

## 2017-07-25 NOTE — Telephone Encounter (Signed)
Called patient to see if she received the [employer] messages. If not for her to please call office for results.

## 2017-07-27 ENCOUNTER — Encounter: Payer: Self-pay | Admitting: Family Medicine

## 2017-07-29 ENCOUNTER — Encounter: Payer: Self-pay | Admitting: Family Medicine

## 2018-02-15 LAB — HM HEPATITIS C SCREENING LAB: HM Hepatitis Screen: NEGATIVE

## 2018-08-07 ENCOUNTER — Emergency Department
Admission: EM | Admit: 2018-08-07 | Discharge: 2018-08-07 | Disposition: A | Discharge status: Home / Self Care | Payer: Medicaid Other | Attending: Emergency Medicine | Admitting: Emergency Medicine

## 2018-08-07 ENCOUNTER — Other Ambulatory Visit: Payer: Self-pay

## 2018-08-07 ENCOUNTER — Encounter: Payer: Self-pay | Admitting: Physician Assistant

## 2018-08-07 DIAGNOSIS — Z87891 Personal history of nicotine dependence: Secondary | ICD-10-CM | POA: Insufficient documentation

## 2018-08-07 DIAGNOSIS — Y939 Activity, unspecified: Secondary | ICD-10-CM | POA: Insufficient documentation

## 2018-08-07 DIAGNOSIS — Y999 Unspecified external cause status: Secondary | ICD-10-CM | POA: Insufficient documentation

## 2018-08-07 DIAGNOSIS — E039 Hypothyroidism, unspecified: Secondary | ICD-10-CM | POA: Insufficient documentation

## 2018-08-07 DIAGNOSIS — I10 Essential (primary) hypertension: Secondary | ICD-10-CM | POA: Insufficient documentation

## 2018-08-07 DIAGNOSIS — X58XXXA Exposure to other specified factors, initial encounter: Secondary | ICD-10-CM | POA: Insufficient documentation

## 2018-08-07 DIAGNOSIS — S39012A Strain of muscle, fascia and tendon of lower back, initial encounter: Secondary | ICD-10-CM

## 2018-08-07 DIAGNOSIS — Z79899 Other long term (current) drug therapy: Secondary | ICD-10-CM | POA: Insufficient documentation

## 2018-08-07 DIAGNOSIS — Y929 Unspecified place or not applicable: Secondary | ICD-10-CM | POA: Insufficient documentation

## 2018-08-07 MED ORDER — GABAPENTIN 300 MG PO CAPS: 300.0000 mg | ORAL_CAPSULE | Freq: Three times a day (TID) | ORAL | 0 refills | Status: DC

## 2018-08-07 MED ORDER — BACLOFEN 10 MG PO TABS: 10.0000 mg | ORAL_TABLET | Freq: Three times a day (TID) | ORAL | 1 refills | Status: AC

## 2018-08-07 MED ORDER — PREDNISONE 10 MG (21) PO TBPK: ORAL_TABLET | ORAL | 0 refills | Status: DC

## 2018-08-07 NOTE — ED Triage Notes (Signed)
Pt states history of slipped disc in back. Pt states is having "severe back pain". Pt ambulatory in no acute distress.

## 2018-08-07 NOTE — ED Notes (Signed)
See triage note  Presents with low back pain   States she had a slipped disc and sometimes she feels like she "throws her back out"  This episode started about 2 weeks ago  Has been using heating pad and some old gabapentin  Ambulates well to treatment room

## 2018-08-07 NOTE — ED Provider Notes (Signed)
[facility] Emergency Department Provider Note  ____________________________________________   First MD Initiated Contact with Patient 08/07/18 1704     (approximate)  I have reviewed the triage vital signs and the nursing notes.   HISTORY  Chief Complaint Back Pain    HPI MADLYN VANMEETEREN is a 23 y.o. female  C/o low back pain for 14 day, no known injury, pain is worse with movement, increased with bending over, denies numbness, tingling, or changes in bowel/urinary habits,  Using otc meds without relief Remainder ros neg   Past Medical History:  Diagnosis Date  . Bipolar 1 disorder (HCC)   . Bipolar 1 disorder (HCC)   . Hypertension   . Seizures (HCC)   . Thyroid disease    hypo    Patient Active Problem List   Diagnosis Date Noted  . Bipolar 1 disorder (HCC)   . Disease of thyroid gland 09/15/2016  . Hypertension 09/15/2016  . 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 Essential (primary) hypertension 04/21/2015  . Transient alteration of awareness 12/15/2014  . Seizure (HCC) 08/25/2014  . Chest pain 05/11/2014  . Anxiety 02/27/2014  . Depression 02/27/2014  . Involuntary movements 02/27/2014  . Panic attack 02/27/2014  . Syncope and collapse 02/27/2014  . Gastro-esophageal reflux disease without esophagitis 10/08/2012  . GERD (gastroesophageal reflux disease) 10/08/2012  . Multiple thyroid nodules 10/08/2012  . 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 TONSILLECTOMY      Prior to Admission medications   Medication Sig Start Date End Date Taking? Authorizing Provider  baclofen (LIORESAL) 10 MG tablet Take 1 tablet (10 mg total) by mouth 3 (three) times daily. 08/07/18 08/07/19  Jenell Dobransky, Roselyn Bering, PA-C  gabapentin (NEURONTIN) 300 MG capsule Take 1 capsule (300 mg total) by mouth 3 (three) times daily. 08/07/18   Ruffin Lada, Roselyn Bering, PA-C  lisinopril (PRINIVIL,ZESTRIL) 10 MG tablet Take 1  tablet (10 mg total) by mouth daily. 07/10/17   Doren Custard, FNP  predniSONE (STERAPRED UNI-PAK 21 TAB) 10 MG (21) TBPK tablet Take 6 pills on day one then decrease by 1 pill each day 08/07/18   Faythe Ghee, PA-C    Allergies Seroquel [quetiapine fumarate] and Quetiapine  History reviewed. No pertinent family history.  Social History Social History   Tobacco Use  . Smoking status: Former Smoker    Types: Cigarettes  . Smokeless tobacco: Never Used  Substance Use Topics  . Alcohol use: No    Frequency: Never  . Drug use: No    Comment: Former Marijuana User    Review of Systems  Constitutional: No fever/chills Eyes: No visual changes. ENT: No sore throat. Respiratory: Denies cough Genitourinary: Negative for dysuria. Musculoskeletal: Positive for back pain. Skin: Negative for rash.    ____________________________________________   PHYSICAL EXAM:  VITAL SIGNS: ED Triage Vitals  Enc Vitals Group     BP 08/07/18 1700 (!) 158/113     Pulse Rate 08/07/18 1700 77     Resp 08/07/18 1700 14     Temp 08/07/18 1700 98.4 F (36.9 C)     Temp Source 08/07/18 1700 Oral     SpO2 08/07/18 1700 100 %     Weight 08/07/18 1653 220 lb (99.8 kg)     Height 08/07/18 1653 5\' 11"  (1.803 m)     Head Circumference --      Peak Flow --      Pain Score  08/07/18 1653 6     Pain Loc --      Pain Edu? --      Excl. in [place]? --     Constitutional: Alert and oriented. Well appearing and in no acute distress. Eyes: Conjunctivae are normal.  Head: Atraumatic. Nose: No congestion/rhinnorhea. Mouth/Throat: Mucous membranes are moist.   Neck:  supple no lymphadenopathy noted Cardiovascular: Normal rate, regular rhythm. Heart sounds are normal Respiratory: Normal respiratory effort.  No retractions, lungs c t a  Abd: soft nontender bs normal all 4 quad GU: deferred Musculoskeletal: FROM all extremities, warm and well perfused.  Decreased rom of back due to discomfort, lumbar spine  nontender, SI joint and paravertebral muscles are tender along the left side of the lower back, negative Slr, full strength in great toes b/l, full strength in lower legs, n/v intact Neurologic:  Normal speech and language.  Skin:  Skin is warm, dry and intact. No rash noted. Psychiatric: Mood and affect are normal. Speech and behavior are normal.  ____________________________________________   LABS (all labs ordered are listed, but only abnormal results are displayed)  Labs Reviewed - No data to display ____________________________________________   ____________________________________________  RADIOLOGY    ____________________________________________   PROCEDURES  Procedure(s) performed: No  Procedures    ____________________________________________   INITIAL IMPRESSION / ASSESSMENT AND PLAN / ED COURSE  Pertinent labs & imaging results that were available during my care of the patient were reviewed by me and considered in my medical decision making (see chart for details).   Patient is a 23 year old female presents emergency department complaint of low back pain.  History of bulging disks.  She took a friend's gabapentin with some relief.  Physical exam shows the area at the left lower part of her back to be tender.  Spine is nontender.  SI joint is tender.  Remainder exam is unremarkable  Explained the findings to the patient.  She was given a prescription for Sterapred, baclofen, and 15 gabapentin.  She is to follow-up with her regular doctor or [facility] clinic orthopedics.  She states she understands will comply.  She was discharged stable condition.     As part of my medical decision making, I reviewed the following data within the electronic MEDICAL RECORD NUMBER Nursing notes reviewed and incorporated, Old chart reviewed, Notes from prior ED visits and [place] Controlled Substance Database  ____________________________________________   FINAL CLINICAL IMPRESSION(S) /  ED DIAGNOSES  Final diagnoses:  Strain of lumbar region, initial encounter      NEW MEDICATIONS STARTED DURING THIS VISIT:  New Prescriptions   BACLOFEN (LIORESAL) 10 MG TABLET    Take 1 tablet (10 mg total) by mouth 3 (three) times daily.   GABAPENTIN (NEURONTIN) 300 MG CAPSULE    Take 1 capsule (300 mg total) by mouth 3 (three) times daily.   PREDNISONE (STERAPRED UNI-PAK 21 TAB) 10 MG (21) TBPK TABLET    Take 6 pills on day one then decrease by 1 pill each day     Note:  This document was prepared using Dragon voice recognition software and may include unintentional dictation errors.     Faythe Ghee, PA-C 08/07/18 1743    Arnaldo Natal, MD 08/07/18 [phone]

## 2018-08-07 NOTE — Discharge Instructions (Addendum)
Follow-up with your regular doctor if not better in 5 to 7 days.  Or you may follow-up [facility] orthopedics.  Please call for an appointment.  Use medication as prescribed.  Apply ice to the lower back.  If you are using heat it must be wet heat not dry as dry heat will make the muscle brittle and make you worse.  Return to emergency department for worsening

## 2019-08-23 ENCOUNTER — Emergency Department (HOSPITAL_COMMUNITY)
Admission: EM | Admit: 2019-08-23 | Discharge: 2019-08-23 | Disposition: A | Discharge status: Home / Self Care | Payer: Medicaid Other | Attending: Emergency Medicine | Admitting: Emergency Medicine

## 2019-08-23 ENCOUNTER — Encounter (HOSPITAL_COMMUNITY): Payer: Self-pay | Admitting: Emergency Medicine

## 2019-08-23 ENCOUNTER — Other Ambulatory Visit: Payer: Self-pay

## 2019-08-23 DIAGNOSIS — R112 Nausea with vomiting, unspecified: Secondary | ICD-10-CM

## 2019-08-23 DIAGNOSIS — R197 Diarrhea, unspecified: Secondary | ICD-10-CM | POA: Insufficient documentation

## 2019-08-23 DIAGNOSIS — R1084 Generalized abdominal pain: Secondary | ICD-10-CM | POA: Insufficient documentation

## 2019-08-23 DIAGNOSIS — Z79899 Other long term (current) drug therapy: Secondary | ICD-10-CM | POA: Insufficient documentation

## 2019-08-23 DIAGNOSIS — I10 Essential (primary) hypertension: Secondary | ICD-10-CM | POA: Insufficient documentation

## 2019-08-23 LAB — URINALYSIS, ROUTINE W REFLEX MICROSCOPIC
Bilirubin Urine: NEGATIVE
Glucose, UA: NEGATIVE mg/dL
Ketones, ur: NEGATIVE mg/dL
Leukocytes,Ua: NEGATIVE
Nitrite: NEGATIVE
Protein, ur: NEGATIVE mg/dL
Specific Gravity, Urine: 1.004 — ABNORMAL LOW (ref 1.005–1.030)
pH: 7 (ref 5.0–8.0)

## 2019-08-23 LAB — COMPREHENSIVE METABOLIC PANEL
ALT: 16 U/L (ref 0–44)
AST: 16 U/L (ref 15–41)
Albumin: 4.4 g/dL (ref 3.5–5.0)
Alkaline Phosphatase: 67 U/L (ref 38–126)
Anion gap: 8 (ref 5–15)
BUN: 10 mg/dL (ref 6–20)
CO2: 26 mmol/L (ref 22–32)
Calcium: 9.3 mg/dL (ref 8.9–10.3)
Chloride: 106 mmol/L (ref 98–111)
Creatinine, Ser: 0.75 mg/dL (ref 0.44–1.00)
GFR calc Af Amer: >60 mL/min (ref >60)
GFR calc non Af Amer: >60 mL/min (ref >60)
Glucose, Bld: 93 mg/dL (ref 70–99)
Potassium: 3.9 mmol/L (ref 3.5–5.1)
Sodium: 140 mmol/L (ref 135–145)
Total Bilirubin: 0.6 mg/dL (ref 0.3–1.2)
Total Protein: 7.8 g/dL (ref 6.5–8.1)

## 2019-08-23 LAB — CBC
HCT: 45.5 % (ref 36.0–46.0)
Hemoglobin: 14.6 g/dL (ref 12.0–15.0)
MCH: 30.5 pg (ref 26.0–34.0)
MCHC: 32.1 g/dL (ref 30.0–36.0)
MCV: 95.2 fL (ref 80.0–100.0)
Platelets: 278 10*3/uL (ref 150–400)
RBC: 4.78 MIL/uL (ref 3.87–5.11)
RDW: 12.1 % (ref 11.5–15.5)
WBC: 8.8 10*3/uL (ref 4.0–10.5)
nRBC: 0 % (ref 0.0–0.2)

## 2019-08-23 LAB — I-STAT BETA HCG BLOOD, ED (MC, WL, AP ONLY): I-stat hCG, quantitative: <5 m[IU]/mL (ref <5)

## 2019-08-23 LAB — LIPASE, BLOOD: Lipase: 21 U/L (ref 11–51)

## 2019-08-23 MED ORDER — ONDANSETRON HCL 4 MG PO TABS: 4.0000 mg | ORAL_TABLET | Freq: Three times a day (TID) | ORAL | 0 refills | Status: DC | PRN

## 2019-08-23 MED ORDER — SODIUM CHLORIDE 0.9 % IV BOLUS
1000.0000 mL | Freq: Once | INTRAVENOUS | Status: AC
  Administered 2019-08-23: 1000 mL via INTRAVENOUS

## 2019-08-23 MED ORDER — ONDANSETRON HCL 4 MG/2ML IJ SOLN
4.0000 mg | Freq: Once | INTRAMUSCULAR | Status: AC
  Administered 2019-08-23: 10:00:00 4 mg via INTRAVENOUS
  Filled 2019-08-23: qty 2

## 2019-08-23 NOTE — ED Notes (Signed)
Patient gave her work a fake note-verified by Clinical research associate to [employer] employer

## 2019-08-23 NOTE — ED Triage Notes (Signed)
Per pt, states she has been vomiting and having diarrhea since Wednesday-feels weak-some abdominal pain

## 2019-08-23 NOTE — ED Provider Notes (Signed)
[facility]-EMERGENCY DEPT Provider Note   CSN: [id number] Arrival date & time: 08/23/19  0831     History Chief Complaint  Patient presents with  . Emesis    Alexandra Solis is a 24 y.o. female.  The history is provided by the patient.  Emesis Severity:  Mild Duration:  3 days Timing:  Intermittent Progression:  Unchanged Chronicity:  New Recent urination:  Normal Relieved by:  Nothing Worsened by:  Nothing Associated symptoms: abdominal pain   Associated symptoms: no arthralgias, no chills, no cough, no diarrhea, no fever, no headaches, no myalgias, no sore throat and no URI   Risk factors: suspect food intake   Risk factors: not pregnant and no prior abdominal surgery        Past Medical History:  Diagnosis Date  . Bipolar 1 disorder (HCC)   . Bipolar 1 disorder (HCC)   . Hypertension   . Seizures (HCC)   . Thyroid disease    hypo    Patient Active Problem List   Diagnosis Date Noted  . Bipolar 1 disorder (HCC)   . Disease of thyroid gland 09/15/2016  . Hypertension 09/15/2016  . 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 Essential (primary) hypertension 04/21/2015  . Transient alteration of awareness 12/15/2014  . Seizure (HCC) 08/25/2014  . Chest pain 05/11/2014  . Anxiety 02/27/2014  . Depression 02/27/2014  . Involuntary movements 02/27/2014  . Panic attack 02/27/2014  . Syncope and collapse 02/27/2014  . Gastro-esophageal reflux disease without esophagitis 10/08/2012  . GERD (gastroesophageal reflux disease) 10/08/2012  . Multiple thyroid nodules 10/08/2012  . 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 TONSILLECTOMY       OB History    Gravida  0   Para  0   Term  0   Preterm  0   AB  0   Living  0     SAB  0   TAB  0   Ectopic  0   Multiple  0   Live Births              No family history on file.  Social History   Tobacco Use  . Smoking  status: Former Smoker    Types: Cigarettes  . Smokeless tobacco: Never Used  Substance Use Topics  . Alcohol use: No  . Drug use: Yes    Types: Marijuana    Comment: Former Marijuana User    Home Medications Prior to Admission medications   Medication Sig Start Date End Date Taking? Authorizing Provider  gabapentin (NEURONTIN) 300 MG capsule Take 1 capsule (300 mg total) by mouth 3 (three) times daily. 08/07/18   Fisher, Roselyn Bering, PA-C  lisinopril (PRINIVIL,ZESTRIL) 10 MG tablet Take 1 tablet (10 mg total) by mouth daily. 07/10/17   Doren Custard, FNP  ondansetron (ZOFRAN) 4 MG tablet Take 1 tablet (4 mg total) by mouth every 8 (eight) hours as needed for up to 20 doses for nausea or vomiting. 08/23/19   Ahmet Schank, DO  predniSONE (STERAPRED UNI-PAK 21 TAB) 10 MG (21) TBPK tablet Take 6 pills on day one then decrease by 1 pill each day 08/07/18   Faythe Ghee, PA-C    Allergies    Seroquel [quetiapine fumarate] and Quetiapine  Review of Systems   Review of Systems  Constitutional: Negative for chills and fever.  HENT: Negative for ear pain and sore throat.  Eyes: Negative for pain and visual disturbance.  Respiratory: Negative for cough and shortness of breath.   Cardiovascular: Negative for chest pain and palpitations.  Gastrointestinal: Positive for abdominal pain and vomiting. Negative for diarrhea.  Genitourinary: Negative for dysuria and hematuria.  Musculoskeletal: Negative for arthralgias, back pain and myalgias.  Skin: Negative for color change and rash.  Neurological: Negative for seizures, syncope and headaches.  All other systems reviewed and are negative.   Physical Exam Updated Vital Signs  ED Triage Vitals  Enc Vitals Group     BP 08/23/19 0842 (!) 157/104     Pulse Rate 08/23/19 0842 80     Resp 08/23/19 0842 18     Temp 08/23/19 0842 98.1 F (36.7 C)     Temp Source 08/23/19 0842 Oral     SpO2 08/23/19 0842 98 %     Weight --      Height --       Head Circumference --      Peak Flow --      Pain Score 08/23/19 0847 4     Pain Loc --      Pain Edu? --      Excl. in [place]? --     Physical Exam Vitals and nursing note reviewed.  Constitutional:      General: She is not in acute distress.    Appearance: She is well-developed. She is not ill-appearing.  HENT:     Head: Normocephalic and atraumatic.     Nose: Nose normal.     Mouth/Throat:     Mouth: Mucous membranes are moist.  Eyes:     Conjunctiva/sclera: Conjunctivae normal.     Pupils: Pupils are equal, round, and reactive to light.  Cardiovascular:     Rate and Rhythm: Normal rate and regular rhythm.     Pulses: Normal pulses.     Heart sounds: Normal heart sounds. No murmur.  Pulmonary:     Effort: Pulmonary effort is normal. No respiratory distress.     Breath sounds: Normal breath sounds.  Abdominal:     General: Abdomen is flat. There is no distension.     Palpations: Abdomen is soft. There is no mass.     Tenderness: There is no abdominal tenderness. There is no guarding or rebound.     Hernia: No hernia is present.  Musculoskeletal:        General: Normal range of motion.     Cervical back: Normal range of motion and neck supple.  Skin:    General: Skin is warm and dry.     Capillary Refill: Capillary refill takes less than 2 seconds.  Neurological:     General: No focal deficit present.     Mental Status: She is alert.  Psychiatric:        Mood and Affect: Mood normal.     ED Results / Procedures / Treatments   Labs (all labs ordered are listed, but only abnormal results are displayed) Labs Reviewed  URINALYSIS, ROUTINE W REFLEX MICROSCOPIC - Abnormal; Notable for the following components:      Result Value   Color, Urine STRAW (*)    Specific Gravity, Urine 1.004 (*)    Hgb urine dipstick LARGE (*)    Bacteria, UA RARE (*)    All other components within normal limits  LIPASE, BLOOD  COMPREHENSIVE METABOLIC PANEL  CBC  I-STAT BETA HCG BLOOD, ED  ([facility], [facility], [facility] ONLY)    EKG None  Radiology No results found.  Procedures Procedures (including critical care time)  Medications Ordered in ED Medications  sodium chloride 0.9 % bolus 1,000 mL (1,000 mLs Intravenous New Bag/Given 08/23/19 0945)  ondansetron (ZOFRAN) injection 4 mg (4 mg Intravenous Given 08/23/19 0945)    ED Course  I have reviewed the triage vital signs and the nursing notes.  Pertinent labs & imaging results that were available during my care of the patient were reviewed by me and considered in my medical decision making (see chart for details).    MDM Rules/Calculators/A&P                       Alexandra Solis is a 24 year old female with history of bipolar, hypertension who presents to the ED with nausea, vomiting, diarrhea.  Patient with unremarkable vitals.  No fever.  Symptoms ongoing for the last several days after eating suspicious food.  Has had some abdominal cramping.  No focal abdominal pain on exam.  Negative Murphy sign, negative McBurney sign.  Doubt appendicitis or hepatobiliary process.  Will give IV fluids, IV Zofran and check basic labs to evaluate for any hepatobiliary process, pancreatitis.  Overall suspect foodborne illness.  Denies pregnancy, urinary tract symptoms.  Patient reevaluated.  Feeling better.  Lab work shows no significant anemia, electrolyte abnormality, kidney injury.  Pregnancy test negative.  Urinalysis negative for infection.  Liver and gallbladder enzymes normal.  Doubt cholecystitis or other hepatobiliary process.  Likely foodborne illness.  Will prescribe Zofran.  Discharged from the ED in good condition.  Understands return precautions.  This chart was dictated using voice recognition software.  Despite best efforts to proofread,  errors can occur which can change the documentation meaning.    Final Clinical Impression(s) / ED Diagnoses Final diagnoses:  Nausea vomiting and diarrhea    Rx / DC Orders ED Discharge  Orders         Ordered    ondansetron (ZOFRAN) 4 MG tablet  Every 8 hours PRN     08/23/19 1047           Jedaiah Rathbun, DO 08/23/19 1047

## 2020-04-11 ENCOUNTER — Emergency Department (HOSPITAL_COMMUNITY)
Admission: EM | Admit: 2020-04-11 | Discharge: 2020-04-11 | Disposition: A | Discharge status: Home / Self Care | Payer: Self-pay | Attending: Emergency Medicine | Admitting: Emergency Medicine

## 2020-04-11 ENCOUNTER — Encounter (HOSPITAL_COMMUNITY): Payer: Self-pay | Admitting: Emergency Medicine

## 2020-04-11 ENCOUNTER — Other Ambulatory Visit: Payer: Self-pay

## 2020-04-11 ENCOUNTER — Emergency Department (HOSPITAL_COMMUNITY): Payer: Medicaid Other

## 2020-04-11 DIAGNOSIS — X58XXXA Exposure to other specified factors, initial encounter: Secondary | ICD-10-CM | POA: Insufficient documentation

## 2020-04-11 DIAGNOSIS — S99912A Unspecified injury of left ankle, initial encounter: Secondary | ICD-10-CM | POA: Insufficient documentation

## 2020-04-11 DIAGNOSIS — M25572 Pain in left ankle and joints of left foot: Secondary | ICD-10-CM

## 2020-04-11 DIAGNOSIS — Z79899 Other long term (current) drug therapy: Secondary | ICD-10-CM | POA: Insufficient documentation

## 2020-04-11 DIAGNOSIS — Z87891 Personal history of nicotine dependence: Secondary | ICD-10-CM | POA: Insufficient documentation

## 2020-04-11 DIAGNOSIS — I10 Essential (primary) hypertension: Secondary | ICD-10-CM | POA: Insufficient documentation

## 2020-04-11 NOTE — ED Provider Notes (Signed)
[facility]-EMERGENCY DEPT Provider Note   CSN: [id number] Arrival date & time: 04/11/20  1616     History Chief Complaint  Patient presents with  . Ankle Injury    left    Alexandra Solis is a 24 y.o. female who presents for evaluation of left ankle pain.  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  She states that yesterday, while she was doing her route, she heard a pop and then had ankle pain.  She states that most of her pain is in the lateral malleolus of the left ankle.  She does not remember twisting it or falling.  She states she has had issues with this ankle before and states that it will commonly roll.  She has been able to ambulate and bear weight on it today but with worsening pain.  She has been taking ibuprofen for the pain.  She denies any numbness/weakness.  The history is provided by the patient.       Past Medical History:  Diagnosis Date  . Bipolar 1 disorder (HCC)   . Bipolar 1 disorder (HCC)   . Hypertension   . Seizures (HCC)   . Thyroid disease    hypo    Patient Active Problem List   Diagnosis Date Noted  . Bipolar 1 disorder (HCC)   . Disease of thyroid gland 09/15/2016  . Hypertension 09/15/2016  . 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 Essential (primary) hypertension 04/21/2015  . Transient alteration of awareness 12/15/2014  . Seizure (HCC) 08/25/2014  . Chest pain 05/11/2014  . Anxiety 02/27/2014  . Depression 02/27/2014  . Involuntary movements 02/27/2014  . Panic attack 02/27/2014  . Syncope and collapse 02/27/2014  . Gastro-esophageal reflux disease without esophagitis 10/08/2012  . GERD (gastroesophageal reflux disease) 10/08/2012  . Multiple thyroid nodules 10/08/2012  . 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 TONSILLECTOMY       OB History    Gravida  0   Para  0   Term  0   Preterm  0   AB  0     Living  0     SAB  0   TAB  0   Ectopic  0   Multiple  0   Live Births              No family history on file.  Social History   Tobacco Use  . Smoking status: Former Smoker    Types: Cigarettes  . Smokeless tobacco: Never Used  Vaping Use  . Vaping Use: Never used  Substance Use Topics  . Alcohol use: No  . Drug use: Yes    Types: Marijuana    Comment: Former Marijuana User    Home Medications Prior to Admission medications   Medication Sig Start Date End Date Taking? Authorizing Provider  gabapentin (NEURONTIN) 300 MG capsule Take 1 capsule (300 mg total) by mouth 3 (three) times daily. 08/07/18   Fisher, Roselyn Bering, PA-C  lisinopril (PRINIVIL,ZESTRIL) 10 MG tablet Take 1 tablet (10 mg total) by mouth daily. 07/10/17   Doren Custard, FNP  ondansetron (ZOFRAN) 4 MG tablet Take 1 tablet (4 mg total) by mouth every 8 (eight) hours as needed for up to 20 doses for nausea or vomiting. 08/23/19   Curatolo, Adam, DO  predniSONE (STERAPRED UNI-PAK 21 TAB) 10 MG (21) TBPK  tablet Take 6 pills on day one then decrease by 1 pill each day 08/07/18   Faythe Ghee, PA-C    Allergies    Seroquel [quetiapine fumarate] and Quetiapine  Review of Systems   Review of Systems  Musculoskeletal:       Left ankle pain  Neurological: Negative for weakness and numbness.  All other systems reviewed and are negative.   Physical Exam Updated Vital Signs BP (!) 143/98 (BP Location: Right Arm)   Pulse 69   Temp 98.1 F (36.7 C) (Oral)   Resp 18   Ht 6' (1.829 m)   Wt 95.3 kg   LMP 03/28/2020 (Approximate)   SpO2 98%   BMI 28.48 kg/m   Physical Exam Vitals and nursing note reviewed.  Constitutional:      Appearance: She is well-developed.  HENT:     Head: Normocephalic and atraumatic.  Cardiovascular:     Pulses:          Dorsalis pedis pulses are 2+ on the left side.  Pulmonary:     Effort: Pulmonary effort is normal.  Musculoskeletal:     Cervical back: Normal range  of motion.     Comments: Tenderness palpation of lateral malleolus of the left ankle with some mild overlying soft tissue swelling.  No deformity or crepitus noted.  No tenderness palpation of the dorsal or plantar aspect of foot.  Dorsiflexion and plantarflexion of left foot intact with any difficulty.  She can wiggle all 5 toes without any difficulty.  No deficits with palpation of the Achilles tendon.  No bony tenderness noted to distal tib-fib, proximal tib-fib of the left lower extremity.  Skin:    General: Skin is warm and dry.     Capillary Refill: Capillary refill takes less than 2 seconds.     Comments: The skin is intact to ankle/foot.  The foot is warm and well perfused with intact sensation  Neurological:     Comments: Sensation intact throughout all major nerve distributions of the feet      ED Results / Procedures / Treatments   Labs (all labs ordered are listed, but only abnormal results are displayed) Labs Reviewed - No data to display  EKG None  Radiology DG Ankle Complete Left  Result Date: 04/11/2020 CLINICAL DATA:  Ankle injury with pain. EXAM: LEFT ANKLE COMPLETE - 3+ VIEW COMPARISON:  None. FINDINGS: There is no evidence of fracture, dislocation, or joint effusion. There is no evidence of arthropathy or other focal bone abnormality. Soft tissues are unremarkable. IMPRESSION: Negative. Electronically Signed   By: Kennith Center M.D.   On: 04/11/2020 17:28    Procedures Procedures (including critical care time)  Medications Ordered in ED Medications - No data to display  ED Course  I have reviewed the triage vital signs and the nursing notes.  Pertinent labs & imaging results that were available during my care of the patient were reviewed by me and considered in my medical decision making (see chart for details).    MDM Rules/Calculators/A&P                          23 year old female who presents for evaluation of left ankle pain and swelling that began  yesterday.  She was doing a workout and states that she felt like something popped.  Since then, has had ankle pain and swelling.  She has been able to ambulate on it but does report worsening  pain with doing so.  She states she has a history of issues to this ankle.  She has been taking ibuprofen.  Initially arrival, she is afebrile, toxic appearing.  Vital signs are stable.  She is neurovascularly intact.  On exam, she has tenderness palpation lateral malleolus of the left ankle with some overlying soft tissue swelling.  No deformity crepitus noted.  Concern for sprain versus fracture.  X-rays ordered at triage.    X-rays reviewed.  No acute bony abnormality.  I personally reviewed the x-rays as well.  Discussed results with patient.  We will plan to treat as an ankle sprain with ASO splint and crutches.  Patient provided with outpatient orthopedics that she can follow-up with. At this time, patient exhibits no emergent life-threatening condition that require further evaluation in ED. Patient had ample opportunity for questions and discussion. All patient's questions were answered with full understanding. Strict return precautions discussed. Patient expresses understanding and agreement to plan.   Portions of this note were generated with Scientist, clinical (histocompatibility and immunogenetics). Dictation errors may occur despite best attempts at proofreading.   Final Clinical Impression(s) / ED Diagnoses Final diagnoses:  Acute left ankle pain    Rx / DC Orders ED Discharge Orders    None       Rosana Hoes 04/11/20 Lowell Guitar, MD 04/11/20 2352

## 2020-04-11 NOTE — Discharge Instructions (Signed)
Your x-ray today looked reassuring.  No evidence of fracture or dislocation of your ankle.  As we discussed, there could be still be a musculoskeletal, ligamentous injury.  Follow the RICE (Rest, Ice, Compression, Elevation) protocol as directed.   You can take Tylenol or Ibuprofen as directed for pain. You can alternate Tylenol and Ibuprofen every 4 hours. If you take Tylenol at 1pm, then you can take Ibuprofen at 5pm. Then you can take Tylenol again at 9pm.   Use splint and crutches as directed.  Follow-up with orthopedics as directed.  Return to emergency department for any worsening pain, redness or swelling of the ankle, numbness/weakness or any other worsening concerning symptoms.

## 2020-04-11 NOTE — ED Notes (Signed)
Ortho called for ankle ASO and crutches.

## 2020-04-11 NOTE — ED Triage Notes (Signed)
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  Pt reports swelling since event. Pt denies numbness or tingling in foot.  Reports slight bruising starting yesterday.    Pt arrives with ankle wrapped in left ankle.    Pt unable to perform full ROM.  Pt only able to bear partial weight on left foot.  Pt reports previous similar incidents to same ankle.

## 2020-05-14 ENCOUNTER — Other Ambulatory Visit: Payer: Self-pay

## 2020-05-14 ENCOUNTER — Ambulatory Visit (HOSPITAL_COMMUNITY)
Admission: EM | Admit: 2020-05-14 | Discharge: 2020-05-14 | Disposition: A | Discharge status: Home / Self Care | Payer: BLUE CROSS/BLUE SHIELD | Attending: Psychiatry | Admitting: Psychiatry

## 2020-05-14 DIAGNOSIS — F319 Bipolar disorder, unspecified: Secondary | ICD-10-CM | POA: Diagnosis not present

## 2020-05-14 LAB — COMPREHENSIVE METABOLIC PANEL
ALT: 16 U/L (ref 0–44)
AST: 17 U/L (ref 15–41)
Albumin: 4 g/dL (ref 3.5–5.0)
Alkaline Phosphatase: 72 U/L (ref 38–126)
Anion gap: 10 (ref 5–15)
BUN: 10 mg/dL (ref 6–20)
CO2: 23 mmol/L (ref 22–32)
Calcium: 9 mg/dL (ref 8.9–10.3)
Chloride: 108 mmol/L (ref 98–111)
Creatinine, Ser: 1.24 mg/dL — ABNORMAL HIGH (ref 0.44–1.00)
GFR, Estimated: >60 mL/min (ref >60)
Glucose, Bld: 85 mg/dL (ref 70–99)
Potassium: 3.8 mmol/L (ref 3.5–5.1)
Sodium: 141 mmol/L (ref 135–145)
Total Bilirubin: 0.6 mg/dL (ref 0.3–1.2)
Total Protein: 7 g/dL (ref 6.5–8.1)

## 2020-05-14 LAB — CBC WITH DIFFERENTIAL/PLATELET
Abs Immature Granulocytes: 0.02 10*3/uL (ref 0.00–0.07)
Basophils Absolute: 0.1 10*3/uL (ref 0.0–0.1)
Basophils Relative: 1 %
Eosinophils Absolute: 0.3 10*3/uL (ref 0.0–0.5)
Eosinophils Relative: 3 %
HCT: 45.1 % (ref 36.0–46.0)
Hemoglobin: 14.8 g/dL (ref 12.0–15.0)
Immature Granulocytes: 0 %
Lymphocytes Relative: 26 %
Lymphs Abs: 1.9 10*3/uL (ref 0.7–4.0)
MCH: 30.4 pg (ref 26.0–34.0)
MCHC: 32.8 g/dL (ref 30.0–36.0)
MCV: 92.6 fL (ref 80.0–100.0)
Monocytes Absolute: 0.4 10*3/uL (ref 0.1–1.0)
Monocytes Relative: 5 %
Neutro Abs: 4.8 10*3/uL (ref 1.7–7.7)
Neutrophils Relative %: 65 %
Platelets: 301 10*3/uL (ref 150–400)
RBC: 4.87 MIL/uL (ref 3.87–5.11)
RDW: 12.2 % (ref 11.5–15.5)
WBC: 7.4 10*3/uL (ref 4.0–10.5)
nRBC: 0 % (ref 0.0–0.2)

## 2020-05-14 LAB — LIPID PANEL
Cholesterol: 163 mg/dL (ref 0–200)
HDL: 47 mg/dL (ref >40)
LDL Cholesterol: 93 mg/dL (ref 0–99)
Total CHOL/HDL Ratio: 3.5 RATIO
Triglycerides: 114 mg/dL (ref <150)
VLDL: 23 mg/dL (ref 0–40)

## 2020-05-14 LAB — TSH: TSH: 0.982 u[IU]/mL (ref 0.350–4.500)

## 2020-05-14 LAB — HEMOGLOBIN A1C
Hgb A1c MFr Bld: 4.9 % (ref 4.8–5.6)
Mean Plasma Glucose: 93.93 mg/dL

## 2020-05-14 MED ORDER — RISPERIDONE 1 MG PO TABS: 1.0000 mg | ORAL_TABLET | Freq: Every day | ORAL | 0 refills | Status: DC

## 2020-05-14 MED ORDER — TRAZODONE HCL 50 MG PO TABS: 50.0000 mg | ORAL_TABLET | Freq: Every day | ORAL | 0 refills | Status: DC

## 2020-05-14 NOTE — ED Notes (Signed)
AVS /Follow-up/Prescriptions reviewed with pt and written copy given to patient. Patient verbalized understanding. Patient denies SI/HI and A/V/H. All belongings returned to patient. Patient escorted off unit by staff.

## 2020-05-14 NOTE — ED Triage Notes (Signed)
Patient denies SI/HI. Patient is having depression and anxiety. Patient states she was diagnosis with Bipolar since 25 years old. Patient lost her insurance and has been off her medication for 5 years.

## 2020-05-14 NOTE — ED Provider Notes (Signed)
Behavioral Health Urgent Care Medical Screening Exam  Patient Name: Alexandra Solis MRN: [id number] Date of Evaluation: 05/14/20 Chief Complaint: Chief Complaint/Presenting Problem: Patient reports she was diagosed with Bipolar at age 24.  She has been off of medications for past 5 yrs due to not having insurance. She currently has coverage and wants to get established with a provider. Diagnosis:  Final diagnoses:  Bipolar 1 disorder (HCC)    History of Present illness: Alexandra Solis is a 25 y.o. female. Patient presents voluntarily as a walk-in to the [facility]. Patient reports that she has a long history of bipolar 1 disorder. She states she was diagnosed as early as 25 years old and has been on various medications over the years. She states that approximately 3 to 4 years ago she was also diagnosed with borderline personality disorder. She states that she presented here today because she is going to get restarted on some medications because she has been off of her medication for approximately 5 years because she lost her insurance. She denies any suicidal homicidal ideations and denies any hallucinations. She reports that she has had very poor sleep which is very common for her and also reports a poor appetite. She reports that she works 2 different jobs, one as a Lawyer, and 1 as a route runner for [employer]. She reports a long history of medications used and failed. She reports that she has been on Abilify which caused significant weight gain, Seroquel which she had a severe behavioral reaction to, lithium which she had blood pressure spiked, Wellbutrin which caused hives but worked well for her, Klonopin worked well for her, Risperdal, Depakote, Lamictal all seem to help her level and mellow her out. She reports she has been on Prozac, Ativan, Librium, Ambien. She reports that she is used melatonin to assist with sleep but it does not work well for her. She reports that trazodone has helped her with sleep in  the past and is requesting to restart it. After discussing medications patient agreed to restart Risperdal 1 mg p.o. nightly and trazodone 50 mg p.o. nightly. Patient has recently had her insurance reinstated with [employer]. Patient is interested in the [facility] program patient was referred to [facility] by social work. Patient was also provided with a list of outpatient resources for outpatient treatment. Patient prescription for Risperdal 1 mg p.o. nightly and trazodone 50 mg p.o. nightly was E prescribed to pharmacy of choice. Patient was also instructed to follow-up with a PCP as she also reported having a history of hypothyroidism but is not being treated for it as well. Patient did agree to have labs drawn today because there is no recent ones in the chart. Labs ordered were CMP, CBC with differential, TSH, lipid panel, A1c, and EKG.  Psychiatric Specialty Exam  Presentation  General Appearance:Appropriate for Environment; Casual  Eye Contact:Good  Speech:Clear and Coherent; Normal Rate  Speech Volume:Normal  Handedness:Right   Mood and Affect  Mood:Euthymic  Affect:Appropriate; Congruent   Thought Process  Thought Processes:Coherent  Descriptions of Associations:Intact  Orientation:Full (Time, Place and Person)  Thought Content:WDL  Hallucinations:None  Ideas of Reference:None  Suicidal Thoughts:No  Homicidal Thoughts:No   Sensorium  Memory:Immediate Good; Recent Good; Remote Good  Judgment:Good  Insight:Good   Executive Functions  Concentration:Good  Attention Span:Good  Recall:Good  Fund of Knowledge:Good  Language:Good   Psychomotor Activity  Psychomotor Activity:Normal   Assets  Assets:Communication Skills; Desire for Improvement; Financial Resources/Insurance; Housing; Physical Health;  Social Support; Transportation   Sleep  Sleep:Poor  Number of hours: No data recorded  Physical Exam: Physical Exam Vitals and nursing note  reviewed.  Constitutional:      Appearance: She is well-developed.  HENT:     Head: Normocephalic.  Eyes:     Pupils: Pupils are equal, round, and reactive to light.  Cardiovascular:     Rate and Rhythm: Normal rate.  Pulmonary:     Effort: Pulmonary effort is normal.  Musculoskeletal:        General: Normal range of motion.  Neurological:     Mental Status: She is alert and oriented to person, place, and time.    Review of Systems  Constitutional: Negative.   HENT: Negative.   Eyes: Negative.   Respiratory: Negative.   Cardiovascular: Negative.   Gastrointestinal: Negative.   Genitourinary: Negative.   Musculoskeletal: Negative.   Skin: Negative.   Neurological: Negative.   Endo/Heme/Allergies: Negative.   Psychiatric/Behavioral: The patient has insomnia.    Blood pressure (!) 126/114, pulse 83, temperature 98.2 F (36.8 C), temperature source Oral, resp. rate 18, SpO2 99 %. There is no height or weight on file to calculate BMI.  Musculoskeletal: Strength & Muscle Tone: within normal limits Gait & Station: normal Patient leans: N/A   [facility] MSE Discharge Disposition for Follow up and Recommendations: Based on my evaluation the patient does not appear to have an emergency medical condition and can be discharged with resources and follow up care in outpatient services for Medication Management and Individual Therapy   Maryfrances Bunnell, FNP 05/14/2020, 2:07 PM

## 2020-05-14 NOTE — BH Assessment (Signed)
Comprehensive Clinical Assessment (CCA) Note  05/14/2020 Alexandra Solis [id number]   Patient is a 25 year old female with a history of Bipolar I Disorder, BPD and anxiety who presents voluntarily to [facility] for assessment.  Patient reports she was diagnosed with Bipolar Disorder at the age of 66.  She has been on various medications on and off over the years and she has had several past inpatient admissions.  She has been off of medications for the past 5 years, as she has not had insurance.  She now has coverage and she is interested in getting back on medications and getting into therapy. She states she has been "trying to manage" however she is struggling at this point.  She reports symptoms of insomnia, poor appetite and worsening anxiety.  She denies SI, HI and AVH.  She admits to daily THC use.  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  She lives alone and eludes to family strain/tension and limited support outside of support from church family.  Treatment options were discussed.  Patient is open to referral to [facility], as she believes her supervisor (also her pastor) will allow her to take some time off if needed.    Disposition: Per Reola Calkins, NP, patient does not meet criteria for inpatient treatment.  PHP has been recommended.  Medications recommendations made and pt to be provided with bridge Rx's until she can start PHP.  Outpatient treatment referral information has been included in the AVS to be provided to pt upon d/c.   Chief Complaint:  Chief Complaint  Patient presents with  . Depression   Visit Diagnosis: Bipolar I Disorder                             Borderline Personality Disorder   CCA Screening, Triage and Referral (STR)  Patient Reported Information How did you hear about [place]? Self (Phreesia 05/14/2020)  Referral name: Myself (Phreesia 05/14/2020)  Referral phone number: No data  recorded  Whom do you see for routine medical problems? I don't have a doctor (Phreesia 05/14/2020)  Practice/Facility Name: No data recorded Practice/Facility Phone Number: No data recorded Name of Contact: No data recorded Contact Number: No data recorded Contact Fax Number: No data recorded Prescriber Name: No data recorded Prescriber Address (if known): No data recorded  What Is the Reason for Your Visit/Call Today? Severe Depression And Anxiety (Phreesia 05/14/2020)  How Long Has This Been Causing You Problems? > than 6 months (Phreesia 05/14/2020)  What Do You Feel Would Help You the Most Today? Medication (Phreesia 05/14/2020)   Have You Recently Been in Any Inpatient Treatment (Hospital/Detox/Crisis Center/28-Day Program)? No (Phreesia 05/14/2020)  Name/Location of Program/Hospital:No data recorded How Long Were You There? No data recorded When Were You Discharged? No data recorded  Have You Ever Received Services From [facility] Before? Yes (Phreesia 05/14/2020)  Who Do You See at [facility]? Nobody In Particular (Phreesia 05/14/2020)   Have You Recently Had Any Thoughts About Hurting Yourself? No (Phreesia 05/14/2020)  Are You Planning to Commit Suicide/Harm Yourself At This time? No (Phreesia 05/14/2020)   Have you Recently Had Thoughts About Hurting Someone Alexandra Solis? No (Phreesia 05/14/2020)  Explanation: No data recorded  Have You Used Any Alcohol or Drugs in the Past 24 Hours? Yes (Phreesia 05/14/2020)  How Long Ago Did You Use Drugs  or Alcohol? No data recorded What Did You Use and How Much? Marijuana And A Blunt (Phreesia 05/14/2020)   Do You Currently Have a Therapist/Psychiatrist? No (Phreesia 05/14/2020)  Name of Therapist/Psychiatrist: No data recorded  Have You Been Recently Discharged From Any Office Practice or Programs? No (Phreesia 05/14/2020)  Explanation of Discharge From Practice/Program: No data recorded    CCA Screening Triage Referral  Assessment Type of Contact: Face-to-Face  Is this Initial or Reassessment? No data recorded Date Telepsych consult ordered in [facility]:  No data recorded Time Telepsych consult ordered in [facility]:  No data recorded  Patient Reported Information Reviewed? Yes  Patient Left Without Being Seen? No data recorded Reason for Not Completing Assessment: No data recorded  Collateral Involvement: N/A   Does Patient Have a Court Appointed Legal Guardian? No data recorded Name and Contact of Legal Guardian: No data recorded If Minor and Not Living with Parent(s), Who has Custody? No data recorded Is CPS involved or ever been involved? Never  Is APS involved or ever been involved? Never   Patient Determined To Be At Risk for Harm To Self or Others Based on Review of Patient Reported Information or Presenting Complaint? No  Method: No data recorded Availability of Means: No data recorded Intent: No data recorded Notification Required: No data recorded Additional Information for Danger to Others Potential: No data recorded Additional Comments for Danger to Others Potential: No data recorded Are There Guns or Other Weapons in Your Home? No data recorded Types of Guns/Weapons: No data recorded Are These Weapons Safely Secured?                            No data recorded Who Could Verify You Are Able To Have These Secured: No data recorded Do You Have any Outstanding Charges, Pending Court Dates, Parole/Probation? No data recorded Contacted To Inform of Risk of Harm To Self or Others: No data recorded  Location of Assessment: [facility] Assessment Services   Does Patient Present under Involuntary Commitment? No  IVC Papers Initial File Date: No data recorded  [place] of Residence: [place]   Patient Currently Receiving the Following Services: Partial Hospitalization   Determination of Need: Routine (7 days)   Options For Referral: Partial Hospitalization     CCA  Biopsychosocial Intake/Chief Complaint:  Patient reports she was diagosed with Bipolar at age 25.  She has been off of medications for past 5 yrs due to not having insurance. She currently has coverage and wants to get established with a provider.  Current Symptoms/Problems: Depression, anxiety, poor sleep and poor appetite.   Patient Reported Schizophrenia/Schizoaffective Diagnosis in Past: No   Strengths: Motivated towards treatment, has support  Preferences: No data recorded Abilities: No data recorded  Type of Services Patient Feels are Needed: Outpatient treatment   Initial Clinical Notes/Concerns: No data recorded  Mental Health Symptoms Depression:  Change in energy/activity; Difficulty Concentrating; Increase/decrease in appetite; Sleep (too much or little)   Duration of Depressive symptoms: Greater than two weeks   Mania:  Change in energy/activity   Anxiety:   Restlessness; Tension; Worrying   Psychosis:  None   Duration of Psychotic symptoms: No data recorded  Trauma:  None   Obsessions:  None   Compulsions:  None   Inattention:  N/A   Hyperactivity/Impulsivity:  N/A   Oppositional/Defiant Behaviors:  None   Emotional Irregularity:  Mood lability   Other Mood/Personality Symptoms:  No data  recorded   Mental Status Exam Appearance and self-care  Stature:  Average   Weight:  Average weight   Clothing:  Casual   Grooming:  Normal   Cosmetic use:  Age appropriate   Posture/gait:  Normal   Motor activity:  Not Remarkable   Sensorium  Attention:  Normal   Concentration:  Normal; Anxiety interferes   Orientation:  X5   Recall/memory:  Normal   Affect and Mood  Affect:  Anxious   Mood:  Euthymic   Relating  Eye contact:  Normal   Facial expression:  Responsive   Attitude toward examiner:  Cooperative   Thought and Language  Speech flow: Clear and Coherent   Thought content:  Appropriate to Mood and Circumstances    Preoccupation:  None   Hallucinations:  None   Organization:  No data recorded  [employer] of Knowledge:  Fair   Intelligence:  Average   Abstraction:  Normal   Judgement:  Fair   Art therapist:  Adequate   Insight:  Fair   Decision Making:  Vacilates   Social Functioning  Social Maturity:  Responsible   Social Judgement:  Normal   Stress  Stressors:  Relationship; Work   Coping Ability:  Programme researcher, broadcasting/film/video Deficits:  No data recorded  Supports:  Friends/Service system     Religion: Religion/Spirituality Are You A Religious Person?: Yes What is Your Religious Affiliation?: Non-Denominational How Might This Affect Treatment?: N/A  Leisure/Recreation: Leisure / Recreation Do You Have Hobbies?: No  Exercise/Diet: Exercise/Diet Do You Exercise?: No Have You Gained or Lost A Significant Amount of Weight in the Past Six Months?: No Do You Follow a Special Diet?: No Do You Have Any Trouble Sleeping?: Yes Explanation of Sleeping Difficulties: 2-3 hour naps most nights   CCA Employment/Education Employment/Work Situation: Employment / Work Situation Employment situation: Employed 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) How long has patient been employed?: UTA Patient's job has been impacted by current illness: No What is the longest time patient has a held a job?: UTA Where was the patient employed at that time?: [employer] Has patient ever been in the [employer]?: No  Education: Education Is Patient Currently Attending School?: No Last Grade Completed: 12 Did Teacher, adult education From [employer]?: No Did You Nutritional therapist?: No Did Heritage manager?: No Did You Have An Individualized Education Program (IIEP): No Did You Have Any Difficulty At [employer]?: No Patient's Education Has Been Impacted by Current Illness: No   CCA Family/Childhood History Family and Relationship History: Family history Marital  status: Single What is your sexual orientation?: UTA Has your sexual activity been affected by drugs, alcohol, medication, or emotional stress?: UTA Does patient have children?: No  Childhood History:  Childhood History By whom was/is the patient raised?: Both parents Additional childhood history information: Patient eludes to not being close with family.  She does not elaborate. Description of patient's relationship with caregiver when they were a child: UTA Patient's description of current relationship with people who raised him/her: UTA How were you disciplined when you got in trouble as a child/adolescent?: UTA Does patient have siblings?:  (UTA) Did patient suffer any verbal/emotional/physical/sexual abuse as a child?: No Did patient suffer from severe childhood neglect?: No Has patient ever been sexually abused/assaulted/raped as an adolescent or adult?: No Was the patient ever a victim of a crime or a disaster?: No Witnessed domestic violence?: No Has patient been affected by domestic  violence as an adult?: No  Child/Adolescent Assessment:     CCA Substance Use Alcohol/Drug Use: Alcohol / Drug Use Pain Medications: See MAR Prescriptions: See MAR Over the Counter: See MAR History of alcohol / drug use?: Yes Substance #1 Name of Substance 1: THC 1 - Age of First Use: 20s 1 - Amount (size/oz): varies 1 - Frequency: daily 1 - Duration: "years" 1 - Last Use / Amount: yesterday, amt unknown    ASAM's:  Six Dimensions of Multidimensional Assessment  Dimension 1:  Acute Intoxication and/or Withdrawal Potential:      Dimension 2:  Biomedical Conditions and Complications:      Dimension 3:  Emotional, Behavioral, or Cognitive Conditions and Complications:     Dimension 4:  Readiness to Change:     Dimension 5:  Relapse, Continued use, or Continued Problem Potential:     Dimension 6:  Recovery/Living Environment:     ASAM Severity Score:    ASAM Recommended Level of  Treatment:     Substance use Disorder (SUD)    Recommendations for Services/Supports/Treatments:    DSM5 Diagnoses: Patient Active Problem List   Diagnosis Date Noted  . Bipolar 1 disorder (HCC)   . Disease of thyroid gland 09/15/2016  . Hypertension 09/15/2016  . 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 Essential (primary) hypertension 04/21/2015  . Transient alteration of awareness 12/15/2014  . Seizure (HCC) 08/25/2014  . Chest pain 05/11/2014  . Anxiety 02/27/2014  . Depression 02/27/2014  . Involuntary movements 02/27/2014  . Panic attack 02/27/2014  . Syncope and collapse 02/27/2014  . Gastro-esophageal reflux disease without esophagitis 10/08/2012  . GERD (gastroesophageal reflux disease) 10/08/2012  . Multiple thyroid nodules 10/08/2012  . 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  Yetta Glassman, [facility]

## 2020-05-14 NOTE — Discharge Instructions (Addendum)
Patient is instructed prior to discharge to: Take all medications as prescribed by his/her mental healthcare provider. Report any adverse effects and or reactions from the medicines to his/her outpatient provider promptly. Patient has been instructed & cautioned: To not engage in alcohol and or illegal drug use while on prescription medicines. In the event of worsening symptoms, patient is instructed to call the crisis hotline, 911 and or go to the nearest ED for appropriate evaluation and treatment of symptoms. To follow-up with his/her primary care provider for your other medical issues, concerns and or health care needs.     Outpatient Treatment Options:  [facility] [address] [place] [address] Phone:  [phone]  [facility]  [address] Phone:  [phone]  [facility] [address] Phone: [phone]  [facility] [address]  [facility] Phone: [phone]  [facility] [address] Phone:  [phone]  [facility] [address] [place] [address] Phone: [phone]

## 2020-05-20 ENCOUNTER — Telehealth (HOSPITAL_COMMUNITY): Payer: Self-pay | Admitting: General Practice

## 2020-05-20 NOTE — Telephone Encounter (Signed)
Care Management - Follow Up [facility] Discharges   Writer attempted to make contact with patient today and was unsuccessful.  Writer was able to leave a HIPPA compliant voice message and will await callback.   

## 2020-06-29 ENCOUNTER — Other Ambulatory Visit: Payer: Self-pay

## 2020-08-18 DIAGNOSIS — R7989 Other specified abnormal findings of blood chemistry: Secondary | ICD-10-CM | POA: Insufficient documentation

## 2020-09-02 LAB — HM HIV SCREENING LAB: HM HIV Screening: NEGATIVE

## 2020-09-02 LAB — HM PAP SMEAR: HM Pap smear: NEGATIVE

## 2020-10-06 ENCOUNTER — Emergency Department
Admission: EM | Admit: 2020-10-06 | Discharge: 2020-10-07 | Disposition: A | Discharge status: Home / Self Care | Payer: BLUE CROSS/BLUE SHIELD | Attending: Emergency Medicine | Admitting: Emergency Medicine

## 2020-10-06 ENCOUNTER — Encounter: Payer: Self-pay | Admitting: Emergency Medicine

## 2020-10-06 DIAGNOSIS — Z79899 Other long term (current) drug therapy: Secondary | ICD-10-CM | POA: Diagnosis not present

## 2020-10-06 DIAGNOSIS — Z23 Encounter for immunization: Secondary | ICD-10-CM | POA: Insufficient documentation

## 2020-10-06 DIAGNOSIS — S51811A Laceration without foreign body of right forearm, initial encounter: Secondary | ICD-10-CM | POA: Insufficient documentation

## 2020-10-06 DIAGNOSIS — I10 Essential (primary) hypertension: Secondary | ICD-10-CM | POA: Diagnosis not present

## 2020-10-06 DIAGNOSIS — W228XXA Striking against or struck by other objects, initial encounter: Secondary | ICD-10-CM | POA: Diagnosis not present

## 2020-10-06 DIAGNOSIS — Z87891 Personal history of nicotine dependence: Secondary | ICD-10-CM | POA: Diagnosis not present

## 2020-10-06 DIAGNOSIS — S59911A Unspecified injury of right forearm, initial encounter: Secondary | ICD-10-CM | POA: Diagnosis present

## 2020-10-06 MED ORDER — HYDROCODONE-ACETAMINOPHEN 5-325 MG PO TABS: 1.0000 | ORAL_TABLET | Freq: Three times a day (TID) | ORAL | 0 refills | Status: DC | PRN

## 2020-10-06 MED ORDER — LIDOCAINE-EPINEPHRINE 1 %-1:100000 IJ SOLN
20.0000 mL | Freq: Once | INTRAMUSCULAR | Status: AC
  Administered 2020-10-06: 20 mL via INTRADERMAL
  Filled 2020-10-06: qty 1

## 2020-10-06 MED ORDER — OXYCODONE-ACETAMINOPHEN 5-325 MG PO TABS
1.0000 | ORAL_TABLET | Freq: Once | ORAL | Status: AC
  Administered 2020-10-07: 1 via ORAL
  Filled 2020-10-06: qty 1

## 2020-10-06 MED ORDER — TETANUS-DIPHTH-ACELL PERTUSSIS 5-2.5-18.5 LF-MCG/0.5 IM SUSY
0.5000 mL | PREFILLED_SYRINGE | Freq: Once | INTRAMUSCULAR | Status: AC
  Administered 2020-10-06: 0.5 mL via INTRAMUSCULAR
  Filled 2020-10-06: qty 0.5

## 2020-10-06 NOTE — ED Provider Notes (Signed)
[facility] Emergency Department Provider Note   ____________________________________________   Event Date/Time   First MD Initiated Contact with Patient 10/06/20 2218     (approximate)  I have reviewed the triage vital signs and the nursing notes.   HISTORY  Chief Complaint Laceration  HPI Alexandra Solis is a 25 y.o. female presents to the ED for evaluation of an accidental laceration to the right wrist.  Patient admittedly punched to the window of a garage door, create a large laceration to the volar aspect of her right wrist.  She denies any distal paresthesias, anesthesia, or disability.      Past Medical History:  Diagnosis Date  . Bipolar 1 disorder (HCC)   . Bipolar 1 disorder (HCC)   . Hypertension   . Seizures (HCC)   . Thyroid disease    hypo    Patient Active Problem List   Diagnosis Date Noted  . Bipolar 1 disorder (HCC)   . Disease of thyroid gland 09/15/2016  . Hypertension 09/15/2016  . 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 Essential (primary) hypertension 04/21/2015  . Transient alteration of awareness 12/15/2014  . Seizure (HCC) 08/25/2014  . Chest pain 05/11/2014  . Anxiety 02/27/2014  . Depression 02/27/2014  . Involuntary movements 02/27/2014  . Panic attack 02/27/2014  . Syncope and collapse 02/27/2014  . Gastro-esophageal reflux disease without esophagitis 10/08/2012  . GERD (gastroesophageal reflux disease) 10/08/2012  . Multiple thyroid nodules 10/08/2012  . 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 TONSILLECTOMY      Prior to Admission medications   Medication Sig Start Date End Date Taking? Authorizing Provider  gabapentin (NEURONTIN) 300 MG capsule Take 1 capsule (300 mg total) by mouth 3 (three) times daily. 08/07/18   Fisher, Roselyn Bering, PA-C  lisinopril (PRINIVIL,ZESTRIL) 10 MG tablet Take 1 tablet (10 mg total) by mouth daily. 07/10/17   Doren Custard, FNP  ondansetron (ZOFRAN) 4 MG tablet Take 1 tablet (4 mg total) by mouth every 8 (eight) hours as needed for up to 20 doses for nausea or vomiting. 08/23/19   Curatolo, Adam, DO  predniSONE (STERAPRED UNI-PAK 21 TAB) 10 MG (21) TBPK tablet Take 6 pills on day one then decrease by 1 pill each day 08/07/18   Faythe Ghee, PA-C  risperiDONE (RISPERDAL) 1 MG tablet Take 1 tablet (1 mg total) by mouth at bedtime. 05/14/20 06/13/20  Money, Gerlene Burdock, FNP  traZODone (DESYREL) 50 MG tablet Take 1 tablet (50 mg total) by mouth at bedtime. 05/14/20   Money, Gerlene Burdock, FNP    Allergies Seroquel [quetiapine fumarate] and Quetiapine  History reviewed. No pertinent family history.  Social History Social History   Tobacco Use  . Smoking status: Former Smoker    Types: Cigarettes  . Smokeless tobacco: Never Used  Vaping Use  . Vaping Use: Never used  Substance Use Topics  . Alcohol use: No  . Drug use: Yes    Types: Marijuana    Comment: Former Marijuana User    Review of Systems  Constitutional: No fever/chills Eyes: No visual changes. ENT: No sore throat. Cardiovascular: Denies chest pain. Respiratory: Denies shortness of breath. Gastrointestinal: No abdominal pain.  No nausea, no vomiting.  No diarrhea.  No constipation. Genitourinary: Negative for dysuria. Musculoskeletal: Negative for back pain. Skin: Negative for rash.  Right wrist laceration as above. Neurological: Negative for headaches, focal weakness or numbness. ____________________________________________  PHYSICAL EXAM:  VITAL SIGNS: ED Triage Vitals  Enc Vitals Group     BP --      Pulse --      Resp --      Temp --      Temp src --      SpO2 10/06/20 2133 98 %     Weight 10/06/20 2208 220 lb (99.8 kg)     Height --      Head Circumference --      Peak Flow --      Pain Score --      Pain Loc --      Pain Edu? --      Excl. in GC? --     Constitutional: Alert and oriented. Well appearing and in no acute  distress. Eyes: Conjunctivae are normal. PERRL. EOMI. Head: Atraumatic. Nose: No congestion/rhinnorhea. Mouth/Throat: Mucous membranes are moist.  Oropharynx non-erythematous. Neck: No stridor.   Cardiovascular: Normal rate, regular rhythm. Grossly normal heart sounds.  Good peripheral circulation. Respiratory: Normal respiratory effort.  No retractions. Lungs CTAB. Gastrointestinal: Soft and nontender. No distention. No abdominal bruits. No CVA tenderness. Musculoskeletal: Right wrist with obvious volar laceration measuring approximately 10 cm.  Patient with active arteriole bleeding noted.  No lower extremity tenderness nor edema.  No joint effusions. Neurologic:  Normal gross sensation.  Normal intrinsic and opposition testing noted.  No gross focal neurologic deficits are appreciated. No gait instability. Skin:  Skin is warm, dry and intact. No rash noted. Psychiatric: Mood and affect are normal. Speech and behavior are normal.  ____________________________________________   LABS (all labs ordered are listed, but only abnormal results are displayed)  Labs Reviewed - No data to display ____________________________________________  EKG   ____________________________________________  RADIOLOGY I, Lissa Hoard, personally viewed and evaluated these images (plain radiographs) as part of my medical decision making, as well as reviewing the written report by the radiologist.  ED MD interpretation:    Official radiology report(s): No results found.  ____________________________________________   PROCEDURES  Procedure(s) performed (including Critical Care):  Tdap 0.5 ml IM Percocet 5-325 mg PO Wrist splint  .Marland KitchenLaceration Repair  Date/Time: 10/06/2020 10:20 PM Performed by: Lissa Hoard, PA-C Authorized by: Lissa Hoard, PA-C   Consent:    Consent obtained:  Verbal   Consent given by:  Patient   Risks, benefits, and alternatives were  discussed: yes     Risks discussed:  Pain, poor wound healing, vascular damage, tendon damage and retained foreign body   Alternatives discussed:  Referral Universal protocol:    Procedure explained and questions answered to patient or proxy's satisfaction: yes     Test results available: yes     Site/side marked: yes     Patient identity confirmed:  Verbally with patient Anesthesia:    Anesthesia method:  Local infiltration   Local anesthetic:  Lidocaine 1% WITH epi Laceration details:    Location:  Shoulder/arm   Shoulder/arm location:  R lower arm   Length (cm):  10   Depth (mm):  5 Pre-procedure details:    Preparation:  Patient was prepped and draped in usual sterile fashion Exploration:    Limited defect created (wound extended): no     Hemostasis achieved with:  Epinephrine and tied off vessels   Wound exploration: entire depth of wound visualized     Contaminated: no   Treatment:    Area cleansed with:  Saline and povidone-iodine   Amount of  cleaning:  Extensive   Irrigation solution:  Sterile saline   Irrigation method:  Syringe   Debridement:  None   Undermining:  None   Scar revision: no     Layers/structures repaired:  Deep subcutaneous Deep subcutaneous:    Suture size:  4-0   Suture material:  Vicryl   Suture technique:  Running   Number of sutures:  1 Skin repair:    Repair method:  Sutures   Suture size:  3-0   Suture material:  Nylon   Suture technique:  Running   Number of sutures:  1 Approximation:    Approximation:  Close Repair type:    Repair type:  Complex Post-procedure details:    Dressing:  Splint for protection and non-adherent dressing   Procedure completion:  Tolerated well, no immediate complications   ____________________________________________   INITIAL IMPRESSION / ASSESSMENT AND PLAN / ED COURSE  As part of my medical decision making, I reviewed the following data within the electronic MEDICAL RECORD NUMBER Notes from prior ED  visits and  Controlled Substance Database  Patient went ED evaluation of laceration to the volar right wrist after she punched through a window [place] door.  Patient was evaluated for complaint, found to be neurovascularly intact.  No tendon deficit or anesthesia.  She tolerated suture repair with multiple layers and tied off vessels to control bleeding.  Patient is discharged with wound care instructions and supplies.  She will follow-up with [facility] for suture removal, and orthopedics for any ongoing pain or disability.  Return precautions have been discussed. ___________________________________________   FINAL CLINICAL IMPRESSION(S) / ED DIAGNOSES  Final diagnoses:  Laceration of right forearm, initial encounter     ED Discharge Orders    None       Note:  This document was prepared using Dragon voice recognition software and may include unintentional dictation errors.    Lissa Hoard, PA-C 10/07/20 0011    Concha Se, MD 10/07/20 [phone]

## 2020-10-06 NOTE — ED Triage Notes (Signed)
Pt reports she was mad and punched her garage window with her right arm. Pt has approx 5-6 inch laceration with bleeding continuing from area. Pressure dressing applied. Pt denies SI and HI.

## 2020-10-06 NOTE — ED Triage Notes (Signed)
EMS brings pt in for reports of punching thru glass, laceration to rt FA

## 2020-10-06 NOTE — Discharge Instructions (Signed)
Keep the wound clean, dry, and covered.  Apply ice to reduce swelling as necessary.  Wear the splint for support and protection.  Follow-up with [facility] for suture removal in 14 days.  Consider evaluation by orthopedics for any ongoing pain or disability.

## 2020-10-07 MED ORDER — CEPHALEXIN 500 MG PO CAPS: 500.0000 mg | ORAL_CAPSULE | Freq: Three times a day (TID) | ORAL | 0 refills | Status: AC

## 2020-10-07 NOTE — ED Notes (Signed)
Wound dressed with vaseline gauze, telfa, and kerlex. Pt educated on wound cleaning and care, dressing techniques and verbalized understanding. Pt placed in wrist brace.

## 2020-10-21 ENCOUNTER — Encounter: Payer: Self-pay | Admitting: Intensive Care

## 2020-10-21 ENCOUNTER — Emergency Department
Admission: EM | Admit: 2020-10-21 | Discharge: 2020-10-21 | Disposition: A | Discharge status: Home / Self Care | Payer: BLUE CROSS/BLUE SHIELD | Attending: Emergency Medicine | Admitting: Emergency Medicine

## 2020-10-21 ENCOUNTER — Other Ambulatory Visit: Payer: Self-pay

## 2020-10-21 DIAGNOSIS — S51811D Laceration without foreign body of right forearm, subsequent encounter: Secondary | ICD-10-CM | POA: Diagnosis not present

## 2020-10-21 DIAGNOSIS — F17210 Nicotine dependence, cigarettes, uncomplicated: Secondary | ICD-10-CM | POA: Insufficient documentation

## 2020-10-21 DIAGNOSIS — Z4802 Encounter for removal of sutures: Secondary | ICD-10-CM | POA: Insufficient documentation

## 2020-10-21 DIAGNOSIS — I10 Essential (primary) hypertension: Secondary | ICD-10-CM | POA: Diagnosis not present

## 2020-10-21 DIAGNOSIS — Z79899 Other long term (current) drug therapy: Secondary | ICD-10-CM | POA: Insufficient documentation

## 2020-10-21 DIAGNOSIS — X58XXXD Exposure to other specified factors, subsequent encounter: Secondary | ICD-10-CM | POA: Insufficient documentation

## 2020-10-21 DIAGNOSIS — E039 Hypothyroidism, unspecified: Secondary | ICD-10-CM | POA: Diagnosis not present

## 2020-10-21 DIAGNOSIS — S59911D Unspecified injury of right forearm, subsequent encounter: Secondary | ICD-10-CM | POA: Diagnosis present

## 2020-10-21 NOTE — ED Triage Notes (Signed)
Patient here for suture removal.

## 2020-10-21 NOTE — ED Notes (Signed)
See triage note  Presents for suture removal   Sutures intact and no redness or swelling noted

## 2020-10-21 NOTE — ED Provider Notes (Signed)
[facility] Emergency Department Provider Note   ____________________________________________   Event Date/Time   First MD Initiated Contact with Patient 10/21/20 1235     (approximate)  I have reviewed the triage vital signs and the nursing notes.   HISTORY  Chief Complaint Suture / Staple Removal    HPI Alexandra Solis is a 25 y.o. female patient presents for laceration right forearm secondary to laceration which occurred on 10/06/2020.  Patient with no concerning complaints.         Past Medical History:  Diagnosis Date   Bipolar 1 disorder (HCC)    Bipolar 1 disorder (HCC)    Hypertension    Seizures (HCC)    Thyroid disease    hypo    Patient Active Problem List   Diagnosis Date Noted   Bipolar 1 disorder (HCC)    Disease of thyroid gland 09/15/2016   Hypertension 09/15/2016   Valproic acid toxicity 05/13/2016   Essential (primary) hypertension 04/21/2015   Transient alteration of awareness 12/15/2014   Seizure (HCC) 08/25/2014   Chest pain 05/11/2014   Anxiety 02/27/2014   Depression 02/27/2014   Involuntary movements 02/27/2014   Panic attack 02/27/2014   Syncope and collapse 02/27/2014   Gastro-esophageal reflux disease without esophagitis 10/08/2012   GERD (gastroesophageal reflux disease) 10/08/2012   Multiple thyroid nodules 10/08/2012   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 Authorizing Provider  gabapentin (NEURONTIN) 300 MG capsule Take 1 capsule (300 mg total) by mouth 3 (three) times daily. 08/07/18   Fisher, Roselyn Bering, PA-C  HYDROcodone-acetaminophen (NORCO) 5-325 MG tablet Take 1 tablet by mouth 3 (three) times daily as needed. 10/06/20   Menshew, Charlesetta Ivory, PA-C  lisinopril (PRINIVIL,ZESTRIL) 10 MG tablet Take 1 tablet (10 mg total) by mouth daily. 07/10/17    Doren Custard, FNP  ondansetron (ZOFRAN) 4 MG tablet Take 1 tablet (4 mg total) by mouth every 8 (eight) hours as needed for up to 20 doses for nausea or vomiting. 08/23/19   Curatolo, Adam, DO  predniSONE (STERAPRED UNI-PAK 21 TAB) 10 MG (21) TBPK tablet Take 6 pills on day one then decrease by 1 pill each day 08/07/18   Faythe Ghee, PA-C  risperiDONE (RISPERDAL) 1 MG tablet Take 1 tablet (1 mg total) by mouth at bedtime. 05/14/20 06/13/20  Money, Gerlene Burdock, FNP  traZODone (DESYREL) 50 MG tablet Take 1 tablet (50 mg total) by mouth at bedtime. 05/14/20   Money, Gerlene Burdock, FNP    Allergies Seroquel [quetiapine fumarate] and Quetiapine  History reviewed. No pertinent family history.  Social History Social History   Tobacco Use   Smoking status: Every Day    Pack years: 0.00    Types: E-cigarettes   Smokeless tobacco: Never  Vaping Use   Vaping Use: Every day  Substance Use Topics   Alcohol use: Yes   Drug use: Yes    Types: Marijuana    Comment: Former Marijuana User    Review of Systems  Constitutional: No fever/chills Eyes: No visual changes. ENT: No sore throat. Cardiovascular: Denies chest pain. Respiratory: Denies shortness of breath. Gastrointestinal: No abdominal pain.  No nausea, no vomiting.  No diarrhea.  No constipation. Genitourinary: Negative for dysuria. Musculoskeletal: Negative for back pain. Skin: Negative for rash. Neurological: Negative for headaches, focal weakness  or numbness. Psychiatric: Bipolar Endocrine: Hypertension and hypothyroidism. Allergic/Immunilogical: Seroquel and Quetiapine____________________________________________   PHYSICAL EXAM:  VITAL SIGNS: ED Triage Vitals  Enc Vitals Group     BP 10/21/20 1135 (!) 138/106     Pulse Rate 10/21/20 1135 79     Resp 10/21/20 1135 16     Temp 10/21/20 1136 98.3 F (36.8 C)     Temp Source 10/21/20 1136 Oral     SpO2 10/21/20 1135 100 %     Weight 10/21/20 1136 220 lb (99.8 kg)     Height  10/21/20 1136 6' (1.829 m)     Head Circumference --      Peak Flow --      Pain Score 10/21/20 1136 0     Pain Loc --      Pain Edu? --      Excl. in GC? --     Constitutional: Alert and oriented. Well appearing and in no acute distress. Cardiovascular: Normal rate, regular rhythm. Grossly normal heart sounds.  Good peripheral circulation. Respiratory: Normal respiratory effort.  No retractions. Lungs CTAB. Neurologic:  Normal speech and language. No gross focal neurologic deficits are appreciated. No gait instability. Skin:  Skin is warm, dry and intact. No rash noted.  Healed right forearm laceration Psychiatric: Mood and affect are normal. Speech and behavior are normal.  ____________________________________________   LABS (all labs ordered are listed, but only abnormal results are displayed)  Labs Reviewed - No data to display ____________________________________________  EKG   ____________________________________________  RADIOLOGY I, Joni Reining, personally viewed and evaluated these images (plain radiographs) as part of my medical decision making, as well as reviewing the written report by the radiologist.  ED MD interpretation:    Official radiology report(s): No results found.  ____________________________________________   PROCEDURES  Procedure(s) performed (including Critical Care):  .Suture Removal  Date/Time: 10/21/2020 12:42 PM Performed by: McLamb, Jerrel Ivory, RN Authorized by: Joni Reining, PA-C   Consent:    Consent obtained:  Verbal   Consent given by:  Patient   Risks, benefits, and alternatives were discussed: yes     Risks discussed:  Bleeding, pain and wound separation Universal protocol:    Procedure explained and questions answered to patient or proxy's satisfaction: yes     Patient identity confirmed:  Verbally with patient and arm band Location:    Location:  Upper extremity   Upper extremity location:  Arm   Arm location:  R  lower arm Procedure details:    Wound appearance:  No signs of infection, good wound healing and clean   Number of sutures removed:  1 Post-procedure details:    Post-removal:  Antibiotic ointment applied and dressing applied   Procedure completion:  Tolerated well, no immediate complications   ____________________________________________   INITIAL IMPRESSION / ASSESSMENT AND PLAN / ED COURSE  As part of my medical decision making, I reviewed the following data within the electronic MEDICAL RECORD NUMBER         Patient presents for suture removal secondary to laceration occurred on 10/06/2020.  Patient was no concerns.  See procedure note for for suture removal.      ____________________________________________   FINAL CLINICAL IMPRESSION(S) / ED DIAGNOSES  Final diagnoses:  Encounter for removal of sutures     ED Discharge Orders     None        Note:  This document was prepared using Dragon voice recognition software and may include unintentional dictation  errors.    Joni Reining, PA-C 10/21/20 1243    Sharman Cheek, MD 10/22/20 [phone]

## 2020-10-21 NOTE — ED Notes (Signed)
Running suture removed w/o diff  Dry dressing applied

## 2020-10-21 NOTE — Discharge Instructions (Addendum)
Follow discharge care instructions. 

## 2021-04-14 IMAGING — CR DG ANKLE COMPLETE 3+V*L*
3 series · 3 of 3 positions shown · non-contrast
Comparison: None.

CLINICAL DATA: Ankle injury with pain.

EXAM:
LEFT ANKLE COMPLETE - 3+ VIEW

[x ankle ap left]
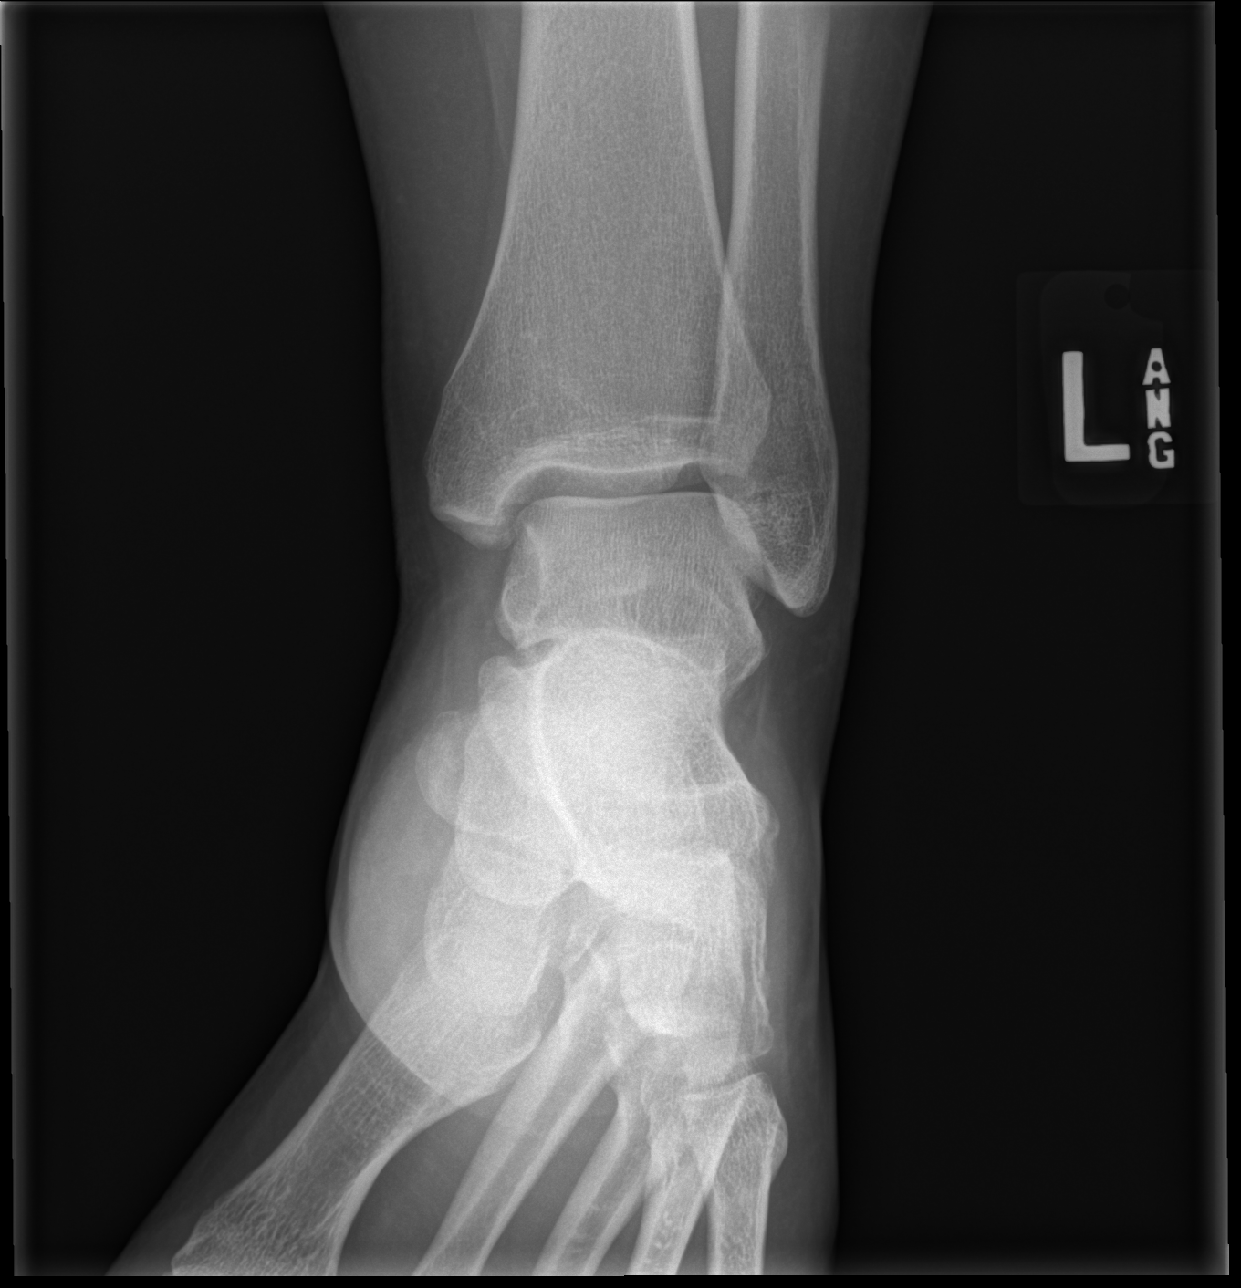

[x ankle obl left]
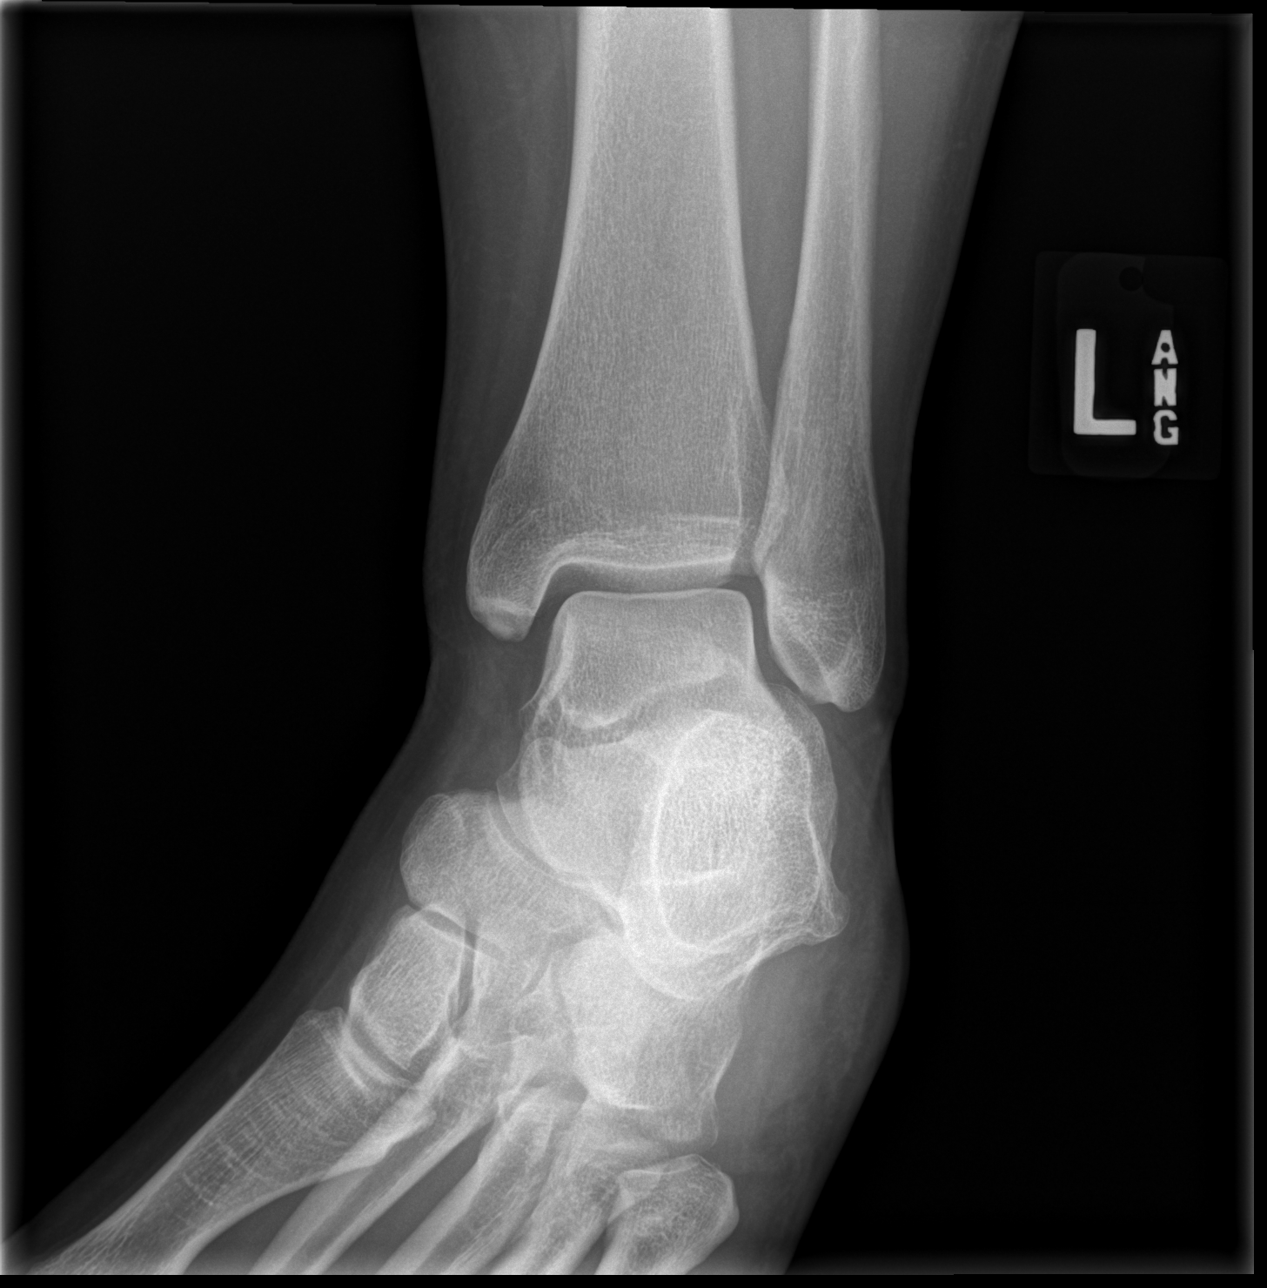

[x ankle lat left]
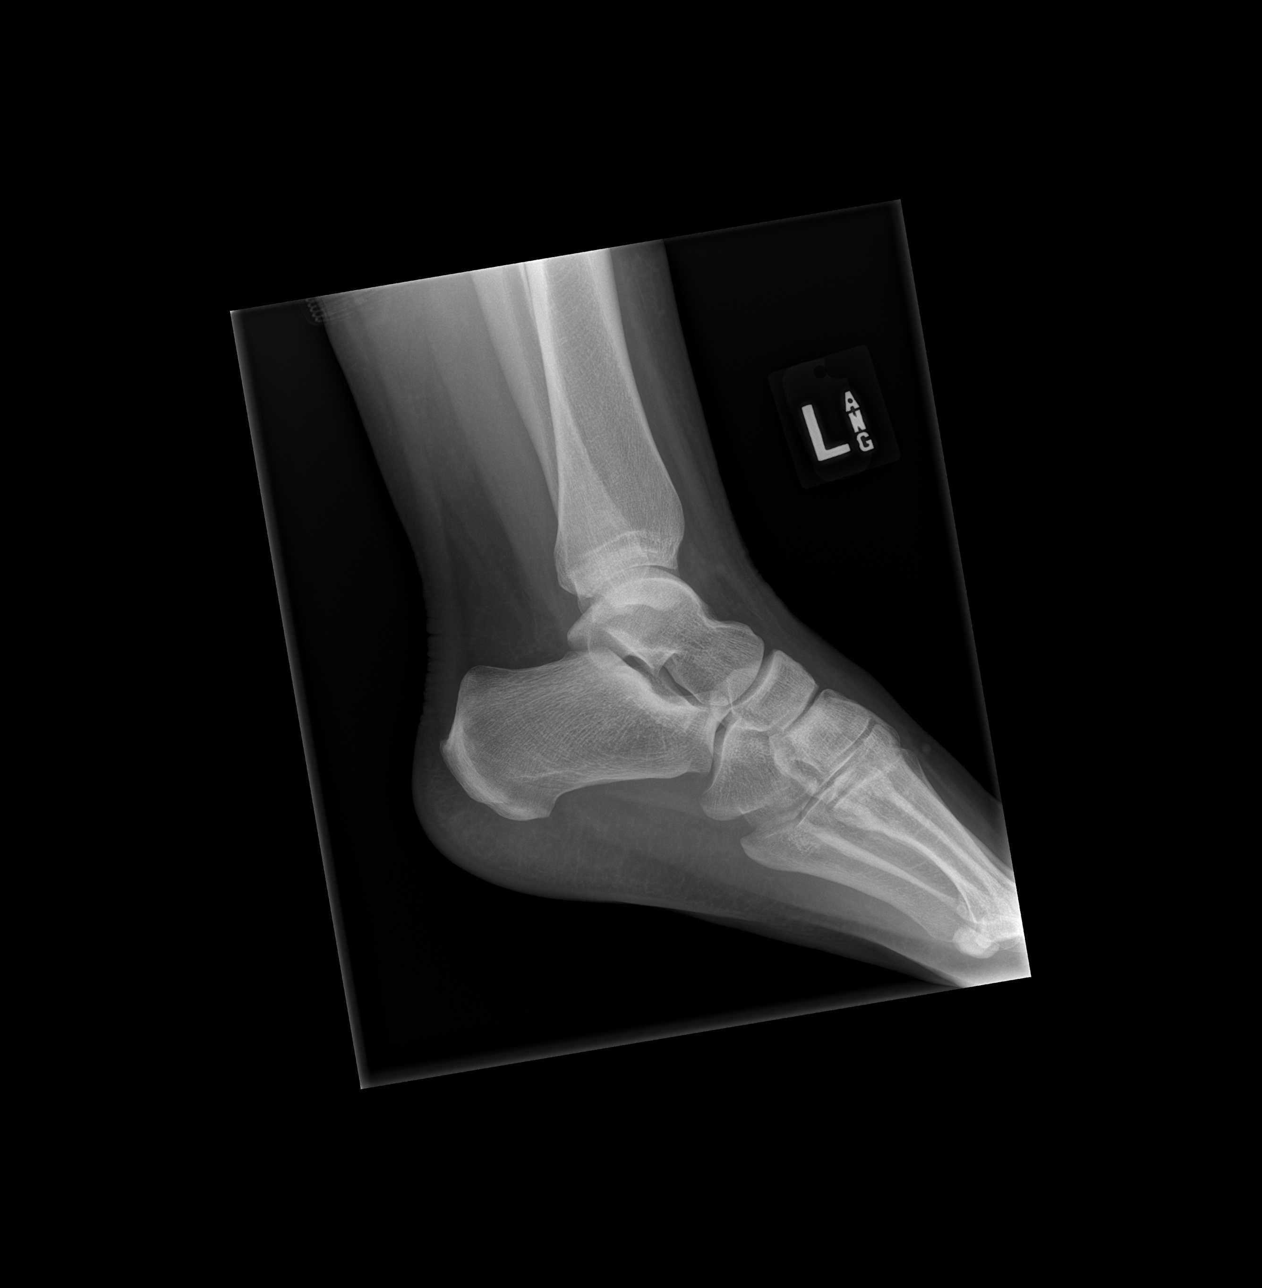

[3 of 3 positions shown; findings below may reference images not displayed]

FINDINGS: There is no evidence of fracture, dislocation, or joint effusion.
There is no evidence of arthropathy or other focal bone abnormality.
Soft tissues are unremarkable.
IMPRESSION: Negative.

## 2022-08-22 NOTE — Progress Notes (Unsigned)
There were no vitals taken for this visit.   Subjective:    Patient ID: Alexandra Solis, female    DOB: 06-05-1995, 27 y.o.   MRN: [id number]  HPI: Alexandra Solis is a 27 y.o. female  No chief complaint on file.  Establish care: her last physical was ***.  Medical history includes ***.  Family history includes ***.  Health maintenance ***.   Relevant past medical, surgical, family and social history reviewed and updated as indicated. Interim medical history since our last visit reviewed. Allergies and medications reviewed and updated.  Review of Systems  Constitutional: Negative for fever or weight change.  Respiratory: Negative for cough and shortness of breath.   Cardiovascular: Negative for chest pain or palpitations.  Gastrointestinal: Negative for abdominal pain, no bowel changes.  Musculoskeletal: Negative for gait problem or joint swelling.  Skin: Negative for rash.  Neurological: Negative for dizziness or headache.  No other specific complaints in a complete review of systems (except as listed in HPI above).      Objective:    There were no vitals taken for this visit.  Wt Readings from Last 3 Encounters:  10/21/20 220 lb (99.8 kg)  10/06/20 220 lb (99.8 kg)  04/11/20 210 lb (95.3 kg)    Physical Exam  Constitutional: Patient appears well-developed and well-nourished. Obese *** No distress.  HEENT: head atraumatic, normocephalic, pupils equal and reactive to light, ears ***, neck supple, throat within normal limits Cardiovascular: Normal rate, regular rhythm and normal heart sounds.  No murmur heard. No BLE edema. Pulmonary/Chest: Effort normal and breath sounds normal. No respiratory distress. Abdominal: Soft.  There is no tenderness. Psychiatric: Patient has a normal mood and affect. behavior is normal. Judgment and thought content normal.  Results for orders placed or performed during the hospital encounter of 08/23/19  Lipase, blood  Result Value Ref  Range   Lipase 21 11 - 51 U/L  Comprehensive metabolic panel  Result Value Ref Range   Sodium 140 135 - 145 mmol/L   Potassium 3.9 3.5 - 5.1 mmol/L   Chloride 106 98 - 111 mmol/L   CO2 26 22 - 32 mmol/L   Glucose, Bld 93 70 - 99 mg/dL   BUN 10 6 - 20 mg/dL   Creatinine, Ser 8.84 0.44 - 1.00 mg/dL   Calcium 9.3 8.9 - 16.6 mg/dL   Total Protein 7.8 6.5 - 8.1 g/dL   Albumin 4.4 3.5 - 5.0 g/dL   AST 16 15 - 41 U/L   ALT 16 0 - 44 U/L   Alkaline Phosphatase 67 38 - 126 U/L   Total Bilirubin 0.6 0.3 - 1.2 mg/dL   GFR calc non Af Amer >60 >60 mL/min   GFR calc Af Amer >60 >60 mL/min   Anion gap 8 5 - 15  CBC  Result Value Ref Range   WBC 8.8 4.0 - 10.5 K/uL   RBC 4.78 3.87 - 5.11 MIL/uL   Hemoglobin 14.6 12.0 - 15.0 g/dL   HCT 06.3 01.6 - 01.0 %   MCV 95.2 80.0 - 100.0 fL   MCH 30.5 26.0 - 34.0 pg   MCHC 32.1 30.0 - 36.0 g/dL   RDW 93.2 35.5 - 73.2 %   Platelets 278 150 - 400 K/uL   nRBC 0.0 0.0 - 0.2 %  Urinalysis, Routine w reflex microscopic  Result Value Ref Range   Color, Urine STRAW (A) YELLOW   APPearance CLEAR CLEAR   Specific Gravity, Urine  1.004 (L) 1.005 - 1.030   pH 7.0 5.0 - 8.0   Glucose, UA NEGATIVE NEGATIVE mg/dL   Hgb urine dipstick LARGE (A) NEGATIVE   Bilirubin Urine NEGATIVE NEGATIVE   Ketones, ur NEGATIVE NEGATIVE mg/dL   Protein, ur NEGATIVE NEGATIVE mg/dL   Nitrite NEGATIVE NEGATIVE   Leukocytes,Ua NEGATIVE NEGATIVE   RBC / HPF 0-5 0 - 5 RBC/hpf   WBC, UA 0-5 0 - 5 WBC/hpf   Bacteria, UA RARE (A) NONE SEEN   Squamous Epithelial / HPF 0-5 0 - 5  I-Stat beta hCG blood, ED  Result Value Ref Range   I-stat hCG, quantitative <5.0 <5 mIU/mL   Comment 3              Assessment & Plan:   Problem List Items Addressed This Visit   None    Follow up plan: No follow-ups on file.

## 2022-08-23 ENCOUNTER — Encounter: Payer: Self-pay | Admitting: Nurse Practitioner

## 2022-08-23 ENCOUNTER — Ambulatory Visit: Payer: Medicaid Other | Admitting: Nurse Practitioner

## 2022-08-23 VITALS — BP 124/84 | HR 70 | Temp 97.9°F | Resp 18 | Ht 71.5 in | Wt 194.5 lb

## 2022-08-23 DIAGNOSIS — Z131 Encounter for screening for diabetes mellitus: Secondary | ICD-10-CM

## 2022-08-23 DIAGNOSIS — E079 Disorder of thyroid, unspecified: Secondary | ICD-10-CM

## 2022-08-23 DIAGNOSIS — M6740 Ganglion, unspecified site: Secondary | ICD-10-CM

## 2022-08-23 DIAGNOSIS — Z130 Encounter for screening for diseases of the blood and blood-forming organs and certain disorders involving the immune mechanism: Secondary | ICD-10-CM

## 2022-08-23 DIAGNOSIS — E042 Nontoxic multinodular goiter: Secondary | ICD-10-CM | POA: Diagnosis not present

## 2022-08-23 DIAGNOSIS — Z7689 Persons encountering health services in other specified circumstances: Secondary | ICD-10-CM

## 2022-08-23 DIAGNOSIS — F319 Bipolar disorder, unspecified: Secondary | ICD-10-CM | POA: Diagnosis not present

## 2022-08-23 DIAGNOSIS — Z1159 Encounter for screening for other viral diseases: Secondary | ICD-10-CM

## 2022-08-23 DIAGNOSIS — Z13220 Encounter for screening for lipoid disorders: Secondary | ICD-10-CM

## 2022-08-23 DIAGNOSIS — I10 Essential (primary) hypertension: Secondary | ICD-10-CM | POA: Diagnosis not present

## 2022-08-23 DIAGNOSIS — K219 Gastro-esophageal reflux disease without esophagitis: Secondary | ICD-10-CM

## 2022-08-23 DIAGNOSIS — Z114 Encounter for screening for human immunodeficiency virus [HIV]: Secondary | ICD-10-CM

## 2022-08-23 NOTE — Assessment & Plan Note (Signed)
Thyroid ultrasound ordered and labs.

## 2022-08-23 NOTE — Assessment & Plan Note (Signed)
Referral placed to psychiatry. 

## 2022-08-23 NOTE — Assessment & Plan Note (Signed)
Reports she has not had problems with her blood pressure since losing 50 pounds.

## 2022-08-23 NOTE — Assessment & Plan Note (Signed)
Thyroid ultrasound ordered and labs. 

## 2022-08-24 LAB — LIPID PANEL
Cholesterol: 146 mg/dL (ref <200)
HDL: 50 mg/dL (ref >=50)
LDL Cholesterol (Calc): 79 mg/dL (calc)
Non-HDL Cholesterol (Calc): 96 mg/dL (calc) (ref <130)
Total CHOL/HDL Ratio: 2.9 (calc) (ref <5.0)
Triglycerides: 83 mg/dL (ref <150)

## 2022-08-24 LAB — CBC WITH DIFFERENTIAL/PLATELET
Absolute Monocytes: 461 cells/uL (ref 200–950)
Basophils Absolute: 96 cells/uL (ref 0–200)
Basophils Relative: 1 %
Eosinophils Absolute: 202 cells/uL (ref 15–500)
Eosinophils Relative: 2.1 %
HCT: 43.2 % (ref 35.0–45.0)
Hemoglobin: 14.5 g/dL (ref 11.7–15.5)
Lymphs Abs: 2813 cells/uL (ref 850–3900)
MCH: 31 pg (ref 27.0–33.0)
MCHC: 33.6 g/dL (ref 32.0–36.0)
MCV: 92.3 fL (ref 80.0–100.0)
MPV: 10 fL (ref 7.5–12.5)
Monocytes Relative: 4.8 %
Neutro Abs: 6029 cells/uL (ref 1500–7800)
Neutrophils Relative %: 62.8 %
Platelets: 336 10*3/uL (ref 140–400)
RBC: 4.68 10*6/uL (ref 3.80–5.10)
RDW: 12 % (ref 11.0–15.0)
Total Lymphocyte: 29.3 %
WBC: 9.6 10*3/uL (ref 3.8–10.8)

## 2022-08-24 LAB — COMPLETE METABOLIC PANEL WITH GFR
AG Ratio: 1.8 (calc) (ref 1.0–2.5)
ALT: 13 U/L (ref 6–29)
AST: 15 U/L (ref 10–30)
Albumin: 4.8 g/dL (ref 3.6–5.1)
Alkaline phosphatase (APISO): 62 U/L (ref 31–125)
BUN: 12 mg/dL (ref 7–25)
CO2: 29 mmol/L (ref 20–32)
Calcium: 9.8 mg/dL (ref 8.6–10.2)
Chloride: 104 mmol/L (ref 98–110)
Creat: 0.81 mg/dL (ref 0.50–0.96)
Globulin: 2.6 g/dL (calc) (ref 1.9–3.7)
Glucose, Bld: 77 mg/dL (ref 65–99)
Potassium: 4.2 mmol/L (ref 3.5–5.3)
Sodium: 139 mmol/L (ref 135–146)
Total Bilirubin: 0.6 mg/dL (ref 0.2–1.2)
Total Protein: 7.4 g/dL (ref 6.1–8.1)
eGFR: 103 mL/min/{1.73_m2} (ref >=60)

## 2022-08-24 LAB — THYROID PANEL WITH TSH
Free Thyroxine Index: 2.2 (ref 1.4–3.8)
T3 Uptake: 30 % (ref 22–35)
T4, Total: 7.3 ug/dL (ref 5.1–11.9)
TSH: 1.66 mIU/L

## 2022-08-24 LAB — HEMOGLOBIN A1C
Hgb A1c MFr Bld: 5.4 % of total Hgb (ref <5.7)
Mean Plasma Glucose: 108 mg/dL
eAG (mmol/L): 6 mmol/L

## 2022-08-24 LAB — HEPATITIS C ANTIBODY: Hepatitis C Ab: NONREACTIVE

## 2022-08-24 LAB — HIV ANTIBODY (ROUTINE TESTING W REFLEX): HIV 1&2 Ab, 4th Generation: NONREACTIVE

## 2022-09-01 ENCOUNTER — Ambulatory Visit
Admission: RE | Admit: 2022-09-01 | Discharge: 2022-09-01 | Disposition: A | Discharge status: Home / Self Care | Payer: BLUE CROSS/BLUE SHIELD | Source: Ambulatory Visit | Attending: Nurse Practitioner | Admitting: Nurse Practitioner

## 2022-09-01 DIAGNOSIS — E042 Nontoxic multinodular goiter: Secondary | ICD-10-CM | POA: Diagnosis not present

## 2022-09-01 DIAGNOSIS — E079 Disorder of thyroid, unspecified: Secondary | ICD-10-CM | POA: Insufficient documentation

## 2022-09-14 ENCOUNTER — Encounter: Payer: Self-pay | Admitting: Nurse Practitioner

## 2022-09-15 ENCOUNTER — Other Ambulatory Visit: Payer: Self-pay | Admitting: Nurse Practitioner

## 2022-09-15 DIAGNOSIS — F319 Bipolar disorder, unspecified: Secondary | ICD-10-CM

## 2022-09-19 ENCOUNTER — Telehealth: Payer: Self-pay | Admitting: Nurse Practitioner

## 2022-09-19 NOTE — Telephone Encounter (Signed)
Copied from CRM [id number]. Topic: General - Other >> Sep 19, 2022  9:57 AM Franchot Heidelberg wrote: Reason for CRM: Pt called about her Psych referral for Bipolarity, says she is without the medication that she needs

## 2022-09-19 NOTE — Telephone Encounter (Signed)
Left message for patient

## 2022-09-19 NOTE — Telephone Encounter (Signed)
Have you heard anything for this lady referral yet.

## 2022-09-22 ENCOUNTER — Encounter: Payer: Medicaid Other | Admitting: Nurse Practitioner

## 2022-10-04 ENCOUNTER — Ambulatory Visit (INDEPENDENT_AMBULATORY_CARE_PROVIDER_SITE_OTHER): Payer: BLUE CROSS/BLUE SHIELD | Admitting: Sports Medicine

## 2022-10-04 ENCOUNTER — Other Ambulatory Visit (INDEPENDENT_AMBULATORY_CARE_PROVIDER_SITE_OTHER): Payer: BLUE CROSS/BLUE SHIELD

## 2022-10-04 ENCOUNTER — Encounter: Payer: Self-pay | Admitting: Sports Medicine

## 2022-10-04 DIAGNOSIS — M5442 Lumbago with sciatica, left side: Secondary | ICD-10-CM | POA: Diagnosis not present

## 2022-10-04 DIAGNOSIS — M5146 Schmorl's nodes, lumbar region: Secondary | ICD-10-CM | POA: Diagnosis not present

## 2022-10-04 DIAGNOSIS — M5136 Other intervertebral disc degeneration, lumbar region: Secondary | ICD-10-CM | POA: Diagnosis not present

## 2022-10-04 DIAGNOSIS — G8929 Other chronic pain: Secondary | ICD-10-CM | POA: Diagnosis not present

## 2022-10-04 DIAGNOSIS — M4316 Spondylolisthesis, lumbar region: Secondary | ICD-10-CM | POA: Diagnosis not present

## 2022-10-04 MED ORDER — MELOXICAM 15 MG PO TABS: 15.0000 mg | ORAL_TABLET | Freq: Every day | ORAL | 1 refills | Status: DC

## 2022-10-04 MED ORDER — CYCLOBENZAPRINE HCL 5 MG PO TABS: 5.0000 mg | ORAL_TABLET | Freq: Every day | ORAL | 0 refills | Status: DC

## 2022-10-04 NOTE — Patient Instructions (Signed)
Start with Flexeril 5mg  x 1 week. If tolerating well, can increase to 2 tablets (10mg ) nightly
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
## 2022-10-05 NOTE — Progress Notes (Unsigned)
Name: Alexandra Solis   MRN: [id number]    DOB: 09-04-1995   Date:10/06/2022       Progress Note  Subjective  Chief Complaint  Chief Complaint  Patient presents with   Annual Exam    HPI  Patient presents for annual CPE.  Diet: she eats whatever is available.  She says it is hard to eat regularly.  Exercise: she says that she has a physical job,  she says she gets in about 3.5 miles a day  Sleep: 3-7 hours Last dental exam:2017 Last eye exam: unknown, she denies any vision issues  Flowsheet Row Office Visit from 10/06/2022 in [facility]  AUDIT-C Score 0      Depression: Phq 9 is  positive, referral was placed to psychiatry    10/06/2022    9:01 AM 08/23/2022    2:49 PM 05/14/2020   12:17 PM 07/10/2017    2:30 PM  Depression screen PHQ 2/9  Decreased Interest 3 3  1   Down, Depressed, Hopeless 3 3  2   PHQ - 2 Score 6 6  3   Altered sleeping 3 3  3   Tired, decreased energy 3 3  3   Change in appetite 3 3  2   Feeling bad or failure about yourself  3 2  3   Trouble concentrating 3 3  1   Moving slowly or fidgety/restless 3 3  2   Suicidal thoughts 0 0  1  PHQ-9 Score 24 23  18   Difficult doing work/chores  Extremely dIfficult  Somewhat difficult     Information is confidential and restricted. Go to Review Flowsheets to unlock data.   Hypertension: BP Readings from Last 3 Encounters:  10/06/22 110/72  08/23/22 124/84  10/21/20 (!) 138/106   Obesity: Wt Readings from Last 3 Encounters:  10/06/22 197 lb 4.8 oz (89.5 kg)  08/23/22 194 lb 8 oz (88.2 kg)  10/21/20 220 lb (99.8 kg)   BMI Readings from Last 3 Encounters:  10/06/22 27.13 kg/m  08/23/22 26.75 kg/m  10/21/20 29.84 kg/m     Vaccines:  HPV: up to at age 69 , ask insurance if age between 13-45  Shingrix: 53-64 yo and ask insurance if covered when patient above 76 yo Pneumonia:  educated and discussed with patient. Flu:  educated and discussed with patient.  Hep C Screening:  08/23/2022 STD testing and prevention (HIV/chl/gon/syphilis): 08/23/2022 Intimate partner violence:none Sexual History : not currently  Menstrual History/LMP/Abnormal Bleeding: [facility]; three weeks ago, regular Incontinence Symptoms: none  Breast cancer:  - Last Mammogram: no  concerns, does not qualify - BRCA gene screening: none  Osteoporosis: Discussed high calcium and vitamin D supplementation, weight bearing exercises  Cervical cancer screening: 08/13/2020  Skin cancer: Discussed monitoring for atypical lesions  Colorectal cancer: no concerns, does not qualify   Lung cancer:   Low Dose CT Chest recommended if Age 58-80 years, 20 pack-year currently smoking OR have quit w/in 15years. Patient does not qualify.   ECG: 05/14/2020  Advanced Care Planning: A voluntary discussion about advance care planning including the explanation and discussion of advance directives.  Discussed health care proxy and Living will, and the patient was able to identify a health care proxy as friend, Newt Minion.  Patient does not have a living will at present time. If patient does have living will, I have requested they bring this to the clinic to be scanned in to their chart.  Lipids: Lab Results  Component Value Date   CHOL  146 08/23/2022   CHOL 163 05/14/2020   CHOL (H) 08/05/2009    176        ATP III CLASSIFICATION:  <200     mg/dL   Desirable  161-096  mg/dL   Borderline High  >=045    mg/dL   High          Lab Results  Component Value Date   HDL 50 08/23/2022   HDL 47 05/14/2020   HDL 32 (L) 08/05/2009   Lab Results  Component Value Date   LDLCALC 79 08/23/2022   LDLCALC 93 05/14/2020   LDLCALC (H) 08/05/2009    119        Total Cholesterol/HDL:CHD Risk Coronary Heart Disease Risk Table                     Men   Women  1/2 Average Risk   3.4   3.3  Average Risk       5.0   4.4  2 X Average Risk   9.6   7.1  3 X Average Risk  23.4   11.0        Use the calculated Patient Ratio above  and the CHD Risk Table to determine the patient's CHD Risk.        ATP III CLASSIFICATION (LDL):  <100     mg/dL   Optimal  409-811  mg/dL   Near or Above                    Optimal  130-159  mg/dL   Borderline  914-782  mg/dL   High  >956     mg/dL   Very High   Lab Results  Component Value Date   TRIG 83 08/23/2022   TRIG 114 05/14/2020   TRIG 123 08/05/2009   Lab Results  Component Value Date   CHOLHDL 2.9 08/23/2022   CHOLHDL 3.5 05/14/2020   CHOLHDL 5.5 08/05/2009   No results found for: "LDLDIRECT"  Glucose: Glucose  Date Value Ref Range Status  05/12/2014 106 (H) 65 - 99 mg/dL Final  21/30/8657 94 65 - 99 mg/dL Final   Glucose, Bld  Date Value Ref Range Status  08/23/2022 77 65 - 99 mg/dL Final    Comment:    .            Fasting reference interval .   05/14/2020 85 70 - 99 mg/dL Final    Comment:    Glucose reference range applies only to samples taken after fasting for at least 8 hours.  08/23/2019 93 70 - 99 mg/dL Final    Comment:    Glucose reference range applies only to samples taken after fasting for at least 8 hours.   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    Past Surgical History:  Procedure Laterality Date   TONSILLECTOMY  Family History  Problem Relation Age of Onset   Cancer Father     Social History   Socioeconomic History   Marital status: Single     Spouse name: Not on file   Number of children: Not on file   Years of education: Not on file   Highest education level: Not on file  Occupational History   Occupation: Conservation officer, nature at [employer]  Tobacco Use   Smoking status: Former    Types: E-cigarettes   Smokeless tobacco: Never  Building services engineer Use: Every day   Substances: Nicotine, Flavoring  Substance and Sexual Activity   Alcohol use: Not Currently   Drug use: Yes    Types: Marijuana    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  Other Topics Concern   Not on file  Social History Narrative   Not on file   Social Determinants of Health   Financial Resource Strain: Low Risk  (10/06/2022)   Overall Financial Resource Strain (CARDIA)    Difficulty of Paying Living Expenses: Not hard at all  Food Insecurity: No Food Insecurity (10/06/2022)   Hunger Vital Sign    Worried About Running Out of Food in the Last Year: Never true    Ran Out of Food in the Last Year: Never true  Transportation Needs: No Transportation Needs (10/06/2022)   PRAPARE - Administrator, Civil Service (Medical): No    Lack of Transportation (Non-Medical): No  Physical Activity: Sufficiently Active (10/06/2022)   Exercise Vital Sign    Days of Exercise per Week: 6 days    Minutes of Exercise per Session: 150+ min  Stress: Stress Concern Present (10/06/2022)   [name] of Occupational Health - Occupational Stress Questionnaire    Feeling of Stress : To some extent  Social Connections: Moderately Isolated (10/06/2022)   Social Connection and Isolation Panel [NHANES]    Frequency of Communication with Friends and Family: More than three times a week    Frequency of Social Gatherings with Friends and Family: More than three times a week    Attends Religious Services: More than 4 times per year    Active Member of [employer] or Organizations: No    Attends Banker Meetings: Never    Marital Status: Never  married  Intimate Partner Violence: Not At Risk (10/06/2022)   Humiliation, Afraid, Rape, and Kick questionnaire    Fear of Current or Ex-Partner: No    Emotionally Abused: No    Physically Abused: No    Sexually Abused: No     Current Outpatient Medications:    cyclobenzaprine (FLEXERIL) 5 MG tablet, Take 1-2 tablets (5-10 mg total) by mouth at bedtime., Disp: 45 tablet, Rfl: 0   meloxicam (MOBIC) 15 MG tablet, Take 1 tablet (15 mg total) by mouth daily., Disp: 30 tablet, Rfl: 1  Allergies  Allergen Reactions   Seroquel [Quetiapine Fumarate] Other (See Comments)    Behavorial changes   Quetiapine     Other reaction(s): Unknown Other reaction(s): Other (See Comments) Behavioral changes     ROS  Constitutional: Negative for fever or weight change.  Respiratory: Negative for cough and shortness of breath.   Cardiovascular: Negative for chest pain or palpitations.  Gastrointestinal: Negative for abdominal pain, no bowel changes.  Musculoskeletal: Negative for gait problem or joint swelling.  Skin: Negative for rash.  Neurological: Negative for dizziness or headache.  No other specific complaints in a complete review of  systems (except as listed in HPI above).   Objective  Vitals:   10/06/22 0903  BP: 110/72  Pulse: 79  Resp: 18  Temp: 97.8 F (36.6 C)  TempSrc: Oral  SpO2: 98%  Weight: 197 lb 4.8 oz (89.5 kg)  Height: 5' 11.5" (1.816 m)    Body mass index is 27.13 kg/m.  Physical Exam Constitutional: Patient appears well-developed and well-nourished. No distress.  HENT: Head: Normocephalic and atraumatic. Ears: B TMs ok, no erythema or effusion; Nose: Nose normal. Mouth/Throat: Oropharynx is clear and moist. No oropharyngeal exudate.  Eyes: Conjunctivae and EOM are normal. Pupils are equal, round, and reactive to light. No scleral icterus.  Neck: Normal range of motion. Neck supple. No JVD present. No thyromegaly present.  Cardiovascular: Normal rate, regular  rhythm and normal heart sounds.  No murmur heard. No BLE edema. Pulmonary/Chest: Effort normal and breath sounds normal. No respiratory distress. Abdominal: Soft. Bowel sounds are normal, no distension. There is no tenderness. no masses Breast: no lumps or masses, no nipple discharge or rashes FEMALE GENITALIA:  External genitalia normal External urethra normal Vaginal vault normal without discharge or lesions Cervix normal without discharge or lesions Bimanual exam normal without masses RECTAL: no rectal masses or hemorrhoids Musculoskeletal: Normal range of motion, no joint effusions. No gross deformities Neurological: he is alert and oriented to person, place, and time. No cranial nerve deficit. Coordination, balance, strength, speech and gait are normal.  Skin: Skin is warm and dry. No rash noted. No erythema.  Psychiatric: Patient has a normal mood and affect. behavior is normal. Judgment and thought content normal.   Hearing Screening  Method: Audiometry   500Hz  1000Hz  2000Hz  4000Hz   Right ear Pass Pass Pass Pass  Left ear Pass Pass Pass Pass    Recent Results (from the past 2160 hour(s))  CBC with Differential/Platelet     Status: None   Collection Time: 08/23/22  3:49 PM  Result Value Ref Range   WBC 9.6 3.8 - 10.8 Thousand/uL   RBC 4.68 3.80 - 5.10 Million/uL   Hemoglobin 14.5 11.7 - 15.5 g/dL   HCT 72.5 36.6 - 44.0 %   MCV 92.3 80.0 - 100.0 fL   MCH 31.0 27.0 - 33.0 pg   MCHC 33.6 32.0 - 36.0 g/dL   RDW 34.7 42.5 - 95.6 %   Platelets 336 140 - 400 Thousand/uL   MPV 10.0 7.5 - 12.5 fL   Neutro Abs 6,029 1,500 - 7,800 cells/uL   Lymphs Abs 2,813 850 - 3,900 cells/uL   Absolute Monocytes 461 200 - 950 cells/uL   Eosinophils Absolute 202 15 - 500 cells/uL   Basophils Absolute 96 0 - 200 cells/uL   Neutrophils Relative % 62.8 %   Total Lymphocyte 29.3 %   Monocytes Relative 4.8 %   Eosinophils Relative 2.1 %   Basophils Relative 1.0 %  COMPLETE METABOLIC PANEL WITH  GFR     Status: None   Collection Time: 08/23/22  3:49 PM  Result Value Ref Range   Glucose, Bld 77 65 - 99 mg/dL    Comment: .            Fasting reference interval .    BUN 12 7 - 25 mg/dL   Creat 3.87 5.64 - 3.32 mg/dL   eGFR 951 > OR = 60 OA/CZY/6.06T0   BUN/Creatinine Ratio SEE NOTE: 6 - 22 (calc)    Comment:    Not Reported: BUN and Creatinine are within  reference range. .    Sodium 139 135 - 146 mmol/L   Potassium 4.2 3.5 - 5.3 mmol/L   Chloride 104 98 - 110 mmol/L   CO2 29 20 - 32 mmol/L   Calcium 9.8 8.6 - 10.2 mg/dL   Total Protein 7.4 6.1 - 8.1 g/dL   Albumin 4.8 3.6 - 5.1 g/dL   Globulin 2.6 1.9 - 3.7 g/dL (calc)   AG Ratio 1.8 1.0 - 2.5 (calc)   Total Bilirubin 0.6 0.2 - 1.2 mg/dL   Alkaline phosphatase (APISO) 62 31 - 125 U/L   AST 15 10 - 30 U/L   ALT 13 6 - 29 U/L  Thyroid Panel With TSH     Status: None   Collection Time: 08/23/22  3:49 PM  Result Value Ref Range   T3 Uptake 30 22 - 35 %   T4, Total 7.3 5.1 - 11.9 mcg/dL   Free Thyroxine Index 2.2 1.4 - 3.8   TSH 1.66 mIU/L    Comment:           Reference Range .           > or = 20 Years  0.40-4.50 .                Pregnancy Ranges           First trimester    0.26-2.66           Second trimester   0.55-2.73           Third trimester    0.43-2.91   Lipid panel     Status: None   Collection Time: 08/23/22  3:49 PM  Result Value Ref Range   Cholesterol 146 <200 mg/dL   HDL 50 > OR = 50 mg/dL   Triglycerides 83 <161 mg/dL   LDL Cholesterol (Calc) 79 mg/dL (calc)    Comment: Reference range: <100 . Desirable range <100 mg/dL for primary prevention;   <70 mg/dL for patients with CHD or diabetic patients  with > or = 2 CHD risk factors. Marland Kitchen LDL-C is now calculated using the Martin-Hopkins  calculation, which is a validated novel method providing  better accuracy than the Friedewald equation in the  estimation of LDL-C.  Horald Pollen et al. Lenox Ahr. 0960;454(09): 2061-2068   (http://education.[facility].com/faq/FAQ164)    Total CHOL/HDL Ratio 2.9 <5.0 (calc)   Non-HDL Cholesterol (Calc) 96 <811 mg/dL (calc)    Comment: For patients with diabetes plus 1 major ASCVD risk  factor, treating to a non-HDL-C goal of <100 mg/dL  (LDL-C of <91 mg/dL) is considered a therapeutic  option.   Hemoglobin A1c     Status: None   Collection Time: 08/23/22  3:49 PM  Result Value Ref Range   Hgb A1c MFr Bld 5.4 <5.7 % of total Hgb    Comment: For the purpose of screening for the presence of diabetes: . <5.7%       Consistent with the absence of diabetes 5.7-6.4%    Consistent with increased risk for diabetes             (prediabetes) > or =6.5%  Consistent with diabetes . This assay result is consistent with a decreased risk of diabetes. . Currently, no consensus exists regarding use of hemoglobin A1c for diagnosis of diabetes in children. . According to American Diabetes Association (ADA) guidelines, hemoglobin A1c <7.0% represents optimal control in non-pregnant diabetic patients. Different metrics may apply to specific patient populations.  Standards of Medical  Care in Diabetes(ADA). .    Mean Plasma Glucose 108 mg/dL   eAG (mmol/L) 6.0 mmol/L    Comment: . This test was performed on the Roche cobas c503 platform. Effective 02/14/22, a change in test platforms from the Abbott Architect to the Roche cobas c503 may have shifted HbA1c results compared to historical results. Based on laboratory validation testing conducted at [facility], the Roche platform relative to the Abbott platform had an average increase in HbA1c value of < or = 0.3%. This difference is within accepted  variability established by the [facility]. Note that not all individuals will have had a shift in their results and direct comparisons between historical and current results for testing conducted on different platforms is not recommended.    Hepatitis C antibody     Status: None   Collection Time: 08/23/22  3:49 PM  Result Value Ref Range   Hepatitis C Ab NON-REACTIVE NON-REACTIVE    Comment: . HCV antibody was non-reactive. There is no laboratory  evidence of HCV infection. . In most cases, no further action is required. However, if recent HCV exposure is suspected, a test for HCV RNA (test code 16109) is suggested. . For additional information please refer to http://education.[facility].com/faq/FAQ22v1 (This link is being provided for informational/ educational purposes only.) .   HIV Antibody (routine testing w rflx)     Status: None   Collection Time: 08/23/22  3:49 PM  Result Value Ref Range   HIV 1&2 Ab, 4th Generation NON-REACTIVE NON-REACTIVE    Comment: HIV-1 antigen and HIV-1/HIV-2 antibodies were not detected. There is no laboratory evidence of HIV infection. Marland Kitchen PLEASE NOTE: This information has been disclosed to you from records whose confidentiality may be protected by state law.  If your state requires such protection, then the state law prohibits you from making any further disclosure of the information without the specific written consent of the person to whom it pertains, or as otherwise permitted by law. A general authorization for the release of medical or other information is NOT sufficient for this purpose. . For additional information please refer to http://education.[facility].com/faq/FAQ106 (This link is being provided for informational/ educational purposes only.) . Marland Kitchen The performance of this assay has not been clinically validated in patients less than 25 years old. .      Fall Risk:    10/06/2022    9:01 AM 08/23/2022    2:49 PM 07/10/2017    2:30 PM  Fall Risk   Falls in the past year? 0 0 No  Number falls in past yr: 0 0   Injury with Fall? 0 0      Functional Status Survey: Is the patient deaf or have difficulty hearing?: Yes Does the patient have  difficulty seeing, even when wearing glasses/contacts?: No Does the patient have difficulty concentrating, remembering, or making decisions?: Yes Does the patient have difficulty walking or climbing stairs?: No Does the patient have difficulty dressing or bathing?: No Does the patient have difficulty doing errands alone such as visiting a doctor's office or shopping?: No   Assessment & Plan  1. Annual physical exam Recommend eating a healthy well balanced diet with portion control Labs recently done  2. Bilateral hearing loss, unspecified hearing loss type patient reports hearing loss, hearing test completed.   -USPSTF grade A and B recommendations reviewed with patient; age-appropriate recommendations, preventive care, screening tests, etc discussed and encouraged; healthy living encouraged; see AVS for patient education given to patient -Discussed importance  of 150 minutes of physical activity weekly, eat two servings of fish weekly, eat one serving of tree nuts ( cashews, pistachios, pecans, almonds.Marland Kitchen) every other day, eat 6 servings of fruit/vegetables daily and drink plenty of water and avoid sweet beverages.

## 2022-10-06 ENCOUNTER — Ambulatory Visit (INDEPENDENT_AMBULATORY_CARE_PROVIDER_SITE_OTHER): Payer: Medicaid Other | Admitting: Nurse Practitioner

## 2022-10-06 ENCOUNTER — Other Ambulatory Visit: Payer: Self-pay

## 2022-10-06 ENCOUNTER — Encounter: Payer: Self-pay | Admitting: Nurse Practitioner

## 2022-10-06 VITALS — BP 110/72 | HR 79 | Temp 97.8°F | Resp 18 | Ht 71.5 in | Wt 197.3 lb

## 2022-10-06 DIAGNOSIS — Z0000 Encounter for general adult medical examination without abnormal findings: Secondary | ICD-10-CM

## 2022-10-06 DIAGNOSIS — H9193 Unspecified hearing loss, bilateral: Secondary | ICD-10-CM | POA: Diagnosis not present

## 2022-10-13 ENCOUNTER — Encounter: Payer: Self-pay | Admitting: Sports Medicine

## 2022-10-13 ENCOUNTER — Ambulatory Visit (INDEPENDENT_AMBULATORY_CARE_PROVIDER_SITE_OTHER): Payer: BLUE CROSS/BLUE SHIELD | Admitting: Orthopedic Surgery

## 2022-10-13 ENCOUNTER — Other Ambulatory Visit (INDEPENDENT_AMBULATORY_CARE_PROVIDER_SITE_OTHER): Payer: BLUE CROSS/BLUE SHIELD

## 2022-10-13 DIAGNOSIS — M25531 Pain in right wrist: Secondary | ICD-10-CM

## 2022-10-13 DIAGNOSIS — G8929 Other chronic pain: Secondary | ICD-10-CM

## 2022-10-16 ENCOUNTER — Encounter: Payer: Self-pay | Admitting: Nurse Practitioner

## 2022-10-17 ENCOUNTER — Other Ambulatory Visit: Payer: Self-pay | Admitting: Nurse Practitioner

## 2022-10-17 DIAGNOSIS — R569 Unspecified convulsions: Secondary | ICD-10-CM

## 2022-10-24 ENCOUNTER — Encounter: Payer: Self-pay | Admitting: Orthopedic Surgery

## 2022-10-24 DIAGNOSIS — M25531 Pain in right wrist: Secondary | ICD-10-CM | POA: Diagnosis not present

## 2022-10-24 MED ORDER — METHYLPREDNISOLONE ACETATE 40 MG/ML IJ SUSP
40.0000 mg | INTRAMUSCULAR | Status: AC | PRN
Start: 2022-10-24 — End: 2022-10-24
  Administered 2022-10-24: 40 mg via INTRA_ARTICULAR

## 2022-10-24 MED ORDER — LIDOCAINE HCL 1 % IJ SOLN
1.0000 mL | INTRAMUSCULAR | Status: AC | PRN
Start: 2022-10-24 — End: 2022-10-24
  Administered 2022-10-24: 1 mL

## 2022-10-24 NOTE — Progress Notes (Signed)
Office Visit Note   Patient: Alexandra Solis           Date of Birth: 10-23-1995           MRN: [id number] Visit Date: 10/13/2022              Requested by: Nadara Mustard, MD [address] [place],  [place] [address] PCP: Berniece Salines, FNP  Chief Complaint  Patient presents with   Right Wrist - Pain      HPI: Patient is a 27 year old woman who was seen for right wrist pain.  Patient states that she has a ganglion cyst.  She is wearing a Velcro splint.  Patient states that [facility]ographs obtained at [facility] advised that she had tendinitis.  Assessment & Plan: Visit Diagnoses:  1. Pain in right wrist     Plan: Right wrist was injected she will continue using her Velcro splint.  Follow-Up Instructions: No follow-ups on file.   Ortho Exam  Patient is alert, oriented, no adenopathy, well-dressed, normal affect, normal respiratory effort. Examination patient has no pain over the scaphoid scapholunate.  She does have pain to palpation over the TFCC and this reproduces her symptoms.  Ulnar grind test is painful.  There is no pain over the first dorsal extensor compartment.  There are no Dupuytren's contractures.  No ganglion cysts.  Imaging: No results found. No images are attached to the encounter.  Labs: Lab Results  Component Value Date   HGBA1C 5.4 08/23/2022   HGBA1C 4.9 05/14/2020   HGBA1C  08/05/2009    5.9 (NOTE) The ADA recommends the following therapeutic goal for glycemic control related to Hgb A1c measurement: Goal of therapy: <6.5 Hgb A1c  Reference: American Diabetes Association: Clinical Practice Recommendations 2010, Diabetes Care, 2010, 33: (Suppl  1).   REPTSTATUS 08/25/2016 FINAL 08/23/2016   CULT >=100,000 COLONIES/mL ESCHERICHIA COLI (A) 08/23/2016   LABORGA ESCHERICHIA COLI (A) 08/23/2016     Lab Results  Component Value Date   ALBUMIN 4.0 05/14/2020   ALBUMIN 4.4 08/23/2019   ALBUMIN 4.3 04/14/2017    No results found for:  "MG" No results found for: "VD25OH"  No results found for: "PREALBUMIN"    Latest Ref Rng & Units 08/23/2022    3:49 PM 05/14/2020    1:40 PM 08/23/2019    8:50 AM  CBC EXTENDED  WBC 3.8 - 10.8 Thousand/uL 9.6  7.4  8.8   RBC 3.80 - 5.10 Million/uL 4.68  4.87  4.78   Hemoglobin 11.7 - 15.5 g/dL 84.6  96.2  95.2   HCT 35.0 - 45.0 % 43.2  45.1  45.5   Platelets 140 - 400 Thousand/uL 336  301  278   NEUT# 1,500 - 7,800 cells/uL 6,029  4.8    Lymph# 850 - 3,900 cells/uL 2,813  1.9       There is no height or weight on file to calculate BMI.  Orders:  Orders Placed This Encounter  Procedures   XR Wrist 2 Views Right   No orders of the defined types were placed in this encounter.    Procedures: Medium Joint Inj: R radiocarpal on 10/24/2022 7:31 AM Indications: pain and diagnostic evaluation Details: 22 G 1.5 in needle, dorsal approach Medications: 1 mL lidocaine 1 %; 40 mg methylPREDNISolone acetate 40 MG/ML Outcome: tolerated well, no immediate complications Procedure, treatment alternatives, risks and benefits explained, specific risks discussed. Consent was given by the patient. Immediately prior to procedure a time out  was called to verify the correct patient, procedure, equipment, support staff and site/side marked as required. Patient was prepped and draped in the usual sterile fashion.      Clinical Data: No additional findings.  ROS:  All other systems negative, except as noted in the HPI. Review of Systems  Objective: Vital Signs: LMP 09/15/2022   Specialty Comments:  No specialty comments available.  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   Past Medical History:  Diagnosis Date   Bipolar 1 disorder (HCC)    Bipolar 1 disorder (HCC)    Hypertension    Seizures (HCC)    Thyroid disease    hypo    Family History  Problem Relation Age of Onset   Cancer Father     Past Surgical History:  Procedure Laterality Date   TONSILLECTOMY     Social History   Occupational History   Occupation: Conservation officer, nature at [employer]  Tobacco Use   Smoking status: Former    Types: E-cigarettes   Smokeless tobacco: Never  Building services engineer Use: Every day   Substances: Nicotine, Flavoring  Substance and Sexual Activity   Alcohol use: Not Currently   Drug use: Yes    Types: Marijuana    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

## 2022-10-26 ENCOUNTER — Encounter: Payer: Medicaid Other | Admitting: Family Medicine

## 2022-10-26 ENCOUNTER — Encounter: Payer: Self-pay | Admitting: Family Medicine

## 2022-10-26 NOTE — Progress Notes (Signed)
Patient did not keep appointment today. She may call to reschedule.  

## 2022-11-08 ENCOUNTER — Other Ambulatory Visit: Payer: Self-pay | Admitting: Sports Medicine

## 2022-11-08 DIAGNOSIS — G8929 Other chronic pain: Secondary | ICD-10-CM

## 2022-11-17 ENCOUNTER — Ambulatory Visit (HOSPITAL_COMMUNITY): Payer: Medicaid Other | Admitting: Psychiatry

## 2022-11-17 ENCOUNTER — Ambulatory Visit: Payer: Medicaid Other | Admitting: Physician Assistant

## 2022-11-17 ENCOUNTER — Encounter (HOSPITAL_COMMUNITY): Payer: Self-pay

## 2022-11-17 ENCOUNTER — Ambulatory Visit: Payer: Medicaid Other | Admitting: Sports Medicine

## 2022-11-24 ENCOUNTER — Ambulatory Visit: Payer: Medicaid Other | Admitting: Sports Medicine

## 2022-12-08 ENCOUNTER — Ambulatory Visit: Payer: BLUE CROSS/BLUE SHIELD | Admitting: Physician Assistant

## 2022-12-12 ENCOUNTER — Encounter: Payer: Self-pay | Admitting: Sports Medicine

## 2022-12-12 ENCOUNTER — Other Ambulatory Visit: Payer: Self-pay

## 2022-12-12 ENCOUNTER — Ambulatory Visit (INDEPENDENT_AMBULATORY_CARE_PROVIDER_SITE_OTHER): Payer: BLUE CROSS/BLUE SHIELD | Admitting: Sports Medicine

## 2022-12-12 DIAGNOSIS — S6981XD Other specified injuries of right wrist, hand and finger(s), subsequent encounter: Secondary | ICD-10-CM | POA: Diagnosis not present

## 2022-12-12 DIAGNOSIS — M25531 Pain in right wrist: Secondary | ICD-10-CM

## 2022-12-12 DIAGNOSIS — M4316 Spondylolisthesis, lumbar region: Secondary | ICD-10-CM

## 2022-12-12 DIAGNOSIS — G8929 Other chronic pain: Secondary | ICD-10-CM

## 2022-12-12 DIAGNOSIS — M5442 Lumbago with sciatica, left side: Secondary | ICD-10-CM

## 2022-12-12 NOTE — Progress Notes (Signed)
[name] - 27 y.o. female MRN [id number]  Date of birth: December 16, 1995  Office Visit Note: Visit Date: 12/12/2022 PCP: Berniece Salines, FNP Referred by: Berniece Salines, FNP  Subjective: Chief Complaint  Patient presents with   Lower Back - Follow-up, Pain   Right Wrist - Pain, Follow-up   HPI: Alexandra Solis is a pleasant 27 y.o. female who presents today for follow-up of low back pain with radicular symptoms, right wrist pain.  She continues with left-sided low back pain.  She has unfortunately not been able to get into physical therapy yet as not connected with a phone call.  Is doing some stretching for the back.  Meloxicam 15 mg once daily, this certainly helped her pain but does not take it away.  Still gets some radiating, electric-like pain down the left lateral thigh to the knee, sometimes just below.  Left wrist pain -pain over the ulnar aspect of the wrist.  Previously saw my partner Dr. Lajoyce Corners on 10/13/2022, he did inject her radiocarpal joint.  This provided some relief but she is still having pain more so over the ulnar side.  Was told in the past maybe she had a ganglion cyst.   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  Pertinent ROS were reviewed with the patient and found to be negative unless otherwise specified above in HPI.   Assessment & Plan: Visit Diagnoses:  1. Chronic left-sided low back pain with left-sided sciatica   2. Pain in right wrist   3. TFCC (triangular fibrocartilage complex) injury, right, subsequent encounter   4. Anterolisthesis of lumbar spine    Plan: Discussed with [name] the nature of her back and wrist pain.  She still is continuing with radicular symptoms on the left side of the low back, previous x-rays do show a small anterior listhesis at the L4-L5 level.  She has unfortunately not been able to get into physical therapy yet, and we will have her make her first  appointment at the office before she leaves.  In terms of the rest, we did ultrasound which shows some calcifications in the TFCC region indicative of prior injury.  We did fit her for a neutral wrist brace which she will wear for work only.  She will continue her meloxicam 15 mg once daily.  We will follow-up for both the wrist and the back in about 6 weeks.  If for some reason she is still having low back pain and/or radicular symptoms, next step would likely be obtaining MRI of the lumbar spine.  Follow-up: Return in about 6 weeks (around 01/23/2023) for for low back and R-wrist .   Meds & Orders: No orders of the defined types were placed in this encounter.   Orders Placed This Encounter  Procedures   [place] Extrem Up Right Ltd     Procedures: No procedures performed      Clinical History: No specialty comments available.  She reports that she has quit smoking. Her smoking use included e-cigarettes. She has never used smokeless tobacco.  Recent Labs    08/23/22 1549  HGBA1C 5.4    Objective:   Vital Signs: There were no vitals taken for this visit.  Physical Exam  Gen: Well-appearing, in no acute distress; non-toxic CV:  Well-perfused. Warm.  Resp: Breathing unlabored on room air; no wheezing. Psych: Fluid speech in conversation; appropriate affect; normal thought process  Neuro: Sensation intact throughout. No gross coordination deficits.   Ortho Exam - Right wrist: No effusion redness or swelling.  There is pain with endrange extension as well as ulnar/radial deviation.  Positive fovea sign.  Equivocal ECU Synergy test.  - Lumbar: No midline spinous process TTP, no overlying skin changes.  There are some generalized TTP around the L4-L5 region on the left with left-sided lumbar paraspinal hypertonicity.  She has full range of motion in flexion and extension but pain at both endrange flexion and extension.  Positive straight leg raise on the left.  Imaging:  [place] Extrem Up Right  [employer]td [employer] musculoskeletal ultrasound of the right wrist was performed today.   Evaluation of Lister's tubercle demonstrates no cortical irregularity.   The radiocarpal joint was seen and visualized and static and dynamic  motion without effusion or cortical irregularity.  Third and fourth dorsal  compartment was seen without evidence of tearing.  The ECU tendon was seen  well-seated within the ulnar groove, evaluated in short and long axis  without evidence of high-grade tearing possible very small split tear  versus anisotropy.  Evaluation of the lateral wrist shows some  calcification within the TFCC indicative of prior injury.    Previous imaging:  XR Wrist 2 Views Right 2 view radiographs of the right wrist shows no bony abnormalities.  There  is a negative ulnar variance.  Past Medical/Family/Surgical/Social History: Medications & Allergies reviewed per EMR, new medications updated. 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   Past Medical History:  Diagnosis Date   Bipolar 1 disorder (HCC)    Bipolar 1 disorder (HCC)    Hypertension    Seizures (HCC)    Thyroid disease    hypo   Family History  Problem Relation Age of Onset   Cancer Father    Past Surgical History:  Procedure Laterality Date   TONSILLECTOMY     Social History   Occupational History   Occupation: Conservation officer, nature at [employer]  Tobacco Use   Smoking status: Former    Types: E-cigarettes   Smokeless tobacco: Never   Vaping Use   Vaping status: Every Day   Substances: Nicotine, Flavoring  Substance and Sexual Activity   Alcohol use: Not Currently   Drug use: Yes    Types: Marijuana    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

## 2022-12-15 ENCOUNTER — Other Ambulatory Visit: Payer: Self-pay | Admitting: Sports Medicine

## 2022-12-15 ENCOUNTER — Ambulatory Visit: Payer: BLUE CROSS/BLUE SHIELD | Admitting: Physical Therapy

## 2022-12-16 MED ORDER — CYCLOBENZAPRINE HCL 5 MG PO TABS: 5.0000 mg | ORAL_TABLET | Freq: Every day | ORAL | 0 refills | Status: DC

## 2023-01-05 ENCOUNTER — Encounter: Payer: BLUE CROSS/BLUE SHIELD | Admitting: Obstetrics & Gynecology

## 2023-01-12 ENCOUNTER — Ambulatory Visit (HOSPITAL_COMMUNITY): Payer: Medicaid Other | Admitting: Psychiatry

## 2023-01-12 ENCOUNTER — Encounter: Payer: Self-pay | Admitting: Nurse Practitioner

## 2023-01-12 ENCOUNTER — Ambulatory Visit: Payer: BLUE CROSS/BLUE SHIELD | Admitting: Nurse Practitioner

## 2023-01-12 ENCOUNTER — Ambulatory Visit
Admission: RE | Admit: 2023-01-12 | Discharge: 2023-01-12 | Disposition: A | Discharge status: Home / Self Care | Payer: BLUE CROSS/BLUE SHIELD | Source: Ambulatory Visit | Attending: Nurse Practitioner | Admitting: Nurse Practitioner

## 2023-01-12 ENCOUNTER — Other Ambulatory Visit: Payer: Self-pay

## 2023-01-12 ENCOUNTER — Ambulatory Visit
Admission: RE | Admit: 2023-01-12 | Discharge: 2023-01-12 | Disposition: A | Discharge status: Home / Self Care | Payer: BLUE CROSS/BLUE SHIELD | Attending: Nurse Practitioner | Admitting: Nurse Practitioner

## 2023-01-12 VITALS — BP 110/72 | HR 64 | Temp 98.0°F | Resp 16 | Wt 197.3 lb

## 2023-01-12 DIAGNOSIS — R0602 Shortness of breath: Secondary | ICD-10-CM

## 2023-01-12 DIAGNOSIS — R42 Dizziness and giddiness: Secondary | ICD-10-CM

## 2023-01-12 DIAGNOSIS — R0789 Other chest pain: Secondary | ICD-10-CM | POA: Insufficient documentation

## 2023-01-12 DIAGNOSIS — R5383 Other fatigue: Secondary | ICD-10-CM

## 2023-01-12 DIAGNOSIS — R001 Bradycardia, unspecified: Secondary | ICD-10-CM

## 2023-01-12 NOTE — Progress Notes (Signed)
BP 110/72   Pulse 64   Temp 98 F (36.7 C) (Oral)   Resp 16   Wt 197 lb 4.8 oz (89.5 kg)   LMP 01/04/2023   SpO2 99%   BMI 27.13 kg/m    Subjective:    Patient ID: Alexandra Solis, female    DOB: November 07, 1995, 27 y.o.   MRN: [id number]  HPI: Alexandra Solis is a 27 y.o. female  Chief Complaint  Patient presents with   Dizziness    Shortness of breath, fatigue, pain on left side for a while   Shortness of breath/dizziness/ fatigue: patient reports it has been going on for awhile. She reports that it is constant. She reports that she has had a weird pain on the left lower side of her chest.  She denies any illness, fever,  urinary complaints, headache.  She also denies any changes in vision.  She says that she just feels run down. She says that she sleeps well at night but never feels rested.  She does report that she has had chest pressure.  She reports the pressure comes and goes. She says it can last for awhile and sometimes it goes away.  She says the dizziness happens while she is working.  She says it is not with change of position.  She denies any cardiac or pulmonary history.  Will get labs, EKG, chest xray,  and walking pulse ox and orthostatics.  She says she is not a current smoker but she does vape.  EKG showed sinus bradycardia at a rate of 46. Will place referral to cardiology.   Orthostatic vital signs:  Laying 110/72, 51 Sitting 110/72, 55 Standing 104/68, 56  Walking pulse ox: spo2 99-98  Relevant past medical, surgical, family and social history reviewed and updated as indicated. Interim medical history since our last visit reviewed. Allergies and medications reviewed and updated.  Review of Systems  Constitutional: Negative for fever or weight change.  Respiratory: Negative for cough and positive for  shortness of breath.   Cardiovascular: positive for chest pain or palpitations.  Gastrointestinal: Negative for abdominal pain, no bowel changes.   Musculoskeletal: Negative for gait problem or joint swelling.  Skin: Negative for rash.  Neurological: positive for dizziness, negative headache. Positive for fatigue No other specific complaints in a complete review of systems (except as listed in HPI above).      Objective:    BP 110/72   Pulse 64   Temp 98 F (36.7 C) (Oral)   Resp 16   Wt 197 lb 4.8 oz (89.5 kg)   LMP 01/04/2023   SpO2 99%   BMI 27.13 kg/m   Wt Readings from Last 3 Encounters:  01/12/23 197 lb 4.8 oz (89.5 kg)  10/06/22 197 lb 4.8 oz (89.5 kg)  08/23/22 194 lb 8 oz (88.2 kg)    Physical Exam  Constitutional: Patient appears well-developed and well-nourished.  No distress.  HEENT: head atraumatic, normocephalic, pupils equal and reactive to light, neck supple, throat within normal limits Cardiovascular: bradycardia rate, regular rhythm and normal heart sounds.  No murmur heard. No BLE edema. Pulmonary/Chest: Effort normal and breath sounds normal. No respiratory distress. Abdominal: Soft.  There is no tenderness. Psychiatric: Patient has a normal mood and affect. behavior is normal. Judgment and thought content normal.  Results for orders placed or performed in visit on 08/23/22  CBC with Differential/Platelet  Result Value Ref Range   WBC 9.6 3.8 - 10.8 Thousand/uL  RBC 4.68 3.80 - 5.10 Million/uL   Hemoglobin 14.5 11.7 - 15.5 g/dL   HCT 10.9 60.4 - 54.0 %   MCV 92.3 80.0 - 100.0 fL   MCH 31.0 27.0 - 33.0 pg   MCHC 33.6 32.0 - 36.0 g/dL   RDW 98.1 19.1 - 47.8 %   Platelets 336 140 - 400 Thousand/uL   MPV 10.0 7.5 - 12.5 fL   Neutro Abs 6,029 1,500 - 7,800 cells/uL   Lymphs Abs 2,813 850 - 3,900 cells/uL   Absolute Monocytes 461 200 - 950 cells/uL   Eosinophils Absolute 202 15 - 500 cells/uL   Basophils Absolute 96 0 - 200 cells/uL   Neutrophils Relative % 62.8 %   Total Lymphocyte 29.3 %   Monocytes Relative 4.8 %   Eosinophils Relative 2.1 %   Basophils Relative 1.0 %  COMPLETE METABOLIC  PANEL WITH GFR  Result Value Ref Range   Glucose, Bld 77 65 - 99 mg/dL   BUN 12 7 - 25 mg/dL   Creat 2.95 6.21 - 3.08 mg/dL   eGFR 657 > OR = 60 QI/ONG/2.95M8   BUN/Creatinine Ratio SEE NOTE: 6 - 22 (calc)   Sodium 139 135 - 146 mmol/L   Potassium 4.2 3.5 - 5.3 mmol/L   Chloride 104 98 - 110 mmol/L   CO2 29 20 - 32 mmol/L   Calcium 9.8 8.6 - 10.2 mg/dL   Total Protein 7.4 6.1 - 8.1 g/dL   Albumin 4.8 3.6 - 5.1 g/dL   Globulin 2.6 1.9 - 3.7 g/dL (calc)   AG Ratio 1.8 1.0 - 2.5 (calc)   Total Bilirubin 0.6 0.2 - 1.2 mg/dL   Alkaline phosphatase (APISO) 62 31 - 125 U/L   AST 15 10 - 30 U/L   ALT 13 6 - 29 U/L  Thyroid Panel With TSH  Result Value Ref Range   T3 Uptake 30 22 - 35 %   T4, Total 7.3 5.1 - 11.9 mcg/dL   Free Thyroxine Index 2.2 1.4 - 3.8   TSH 1.66 mIU/L  Lipid panel  Result Value Ref Range   Cholesterol 146 <200 mg/dL   HDL 50 > OR = 50 mg/dL   Triglycerides 83 <413 mg/dL   LDL Cholesterol (Calc) 79 mg/dL (calc)   Total CHOL/HDL Ratio 2.9 <5.0 (calc)   Non-HDL Cholesterol (Calc) 96 <244 mg/dL (calc)  Hemoglobin W1U  Result Value Ref Range   Hgb A1c MFr Bld 5.4 <5.7 % of total Hgb   Mean Plasma Glucose 108 mg/dL   eAG (mmol/L) 6.0 mmol/L  Hepatitis C antibody  Result Value Ref Range   Hepatitis C Ab NON-REACTIVE NON-REACTIVE  HIV Antibody (routine testing w rflx)  Result Value Ref Range   HIV 1&2 Ab, 4th Generation NON-REACTIVE NON-REACTIVE      Assessment & Plan:   Problem List Items Addressed This Visit   None Visit Diagnoses     Shortness of breath    -  Primary   ekg- sinus bradycardia, referral to cardiology, labs and chest xray   Relevant Orders   EKG 12-Lead   DG Chest 2 View   CBC with Differential/Platelet   COMPLETE METABOLIC PANEL WITH GFR   TSH   Ambulatory referral to Cardiology   Chest pressure       ekg- sinus bradycardia, referral to cardiology, labs and chest xray   Relevant Orders   EKG 12-Lead   DG Chest 2 View   CBC with  Differential/Platelet  COMPLETE METABOLIC PANEL WITH GFR   TSH   Ambulatory referral to Cardiology   Dizziness       ekg- sinus bradycardia, referral to cardiology, labs and chest xray   Relevant Orders   EKG 12-Lead   DG Chest 2 View   CBC with Differential/Platelet   COMPLETE METABOLIC PANEL WITH GFR   TSH   Ambulatory referral to Cardiology   Other fatigue       ekg- sinus bradycardia, referral to cardiology, labs and chest xray   Relevant Orders   VITAMIN D 25 Hydroxy (Vit-D Deficiency, Fractures)   Iron, TIBC and Ferritin Panel   B12 and Folate Panel   Ambulatory referral to Cardiology   Bradycardia by electrocardiogram       ekg showed sinus bradycardia, will place referral to cardiology   Relevant Orders   Ambulatory referral to Cardiology        Follow up plan: Return if symptoms worsen or fail to improve.

## 2023-01-13 ENCOUNTER — Telehealth: Payer: Self-pay

## 2023-01-13 LAB — COMPLETE METABOLIC PANEL WITH GFR
AG Ratio: 1.7 (calc) (ref 1.0–2.5)
ALT: 13 U/L (ref 6–29)
AST: 16 U/L (ref 10–30)
Albumin: 4.4 g/dL (ref 3.6–5.1)
Alkaline phosphatase (APISO): 64 U/L (ref 31–125)
BUN/Creatinine Ratio: 14 (calc) (ref 6–22)
BUN: 14 mg/dL (ref 7–25)
CO2: 23 mmol/L (ref 20–32)
Calcium: 9.1 mg/dL (ref 8.6–10.2)
Chloride: 105 mmol/L (ref 98–110)
Creat: 1.03 mg/dL — ABNORMAL HIGH (ref 0.50–0.96)
Globulin: 2.6 g/dL (ref 1.9–3.7)
Glucose, Bld: 84 mg/dL (ref 65–99)
Potassium: 4.7 mmol/L (ref 3.5–5.3)
Sodium: 137 mmol/L (ref 135–146)
Total Bilirubin: 0.4 mg/dL (ref 0.2–1.2)
Total Protein: 7 g/dL (ref 6.1–8.1)
eGFR: 77 mL/min/{1.73_m2} (ref >=60)

## 2023-01-13 LAB — CBC WITH DIFFERENTIAL/PLATELET
Absolute Monocytes: 401 {cells}/uL (ref 200–950)
Basophils Absolute: 82 {cells}/uL (ref 0–200)
Basophils Relative: 1.2 %
Eosinophils Absolute: 265 {cells}/uL (ref 15–500)
Eosinophils Relative: 3.9 %
HCT: 41.2 % (ref 35.0–45.0)
Hemoglobin: 13.8 g/dL (ref 11.7–15.5)
Lymphs Abs: 2149 {cells}/uL (ref 850–3900)
MCH: 31.2 pg (ref 27.0–33.0)
MCHC: 33.5 g/dL (ref 32.0–36.0)
MCV: 93 fL (ref 80.0–100.0)
MPV: 10.2 fL (ref 7.5–12.5)
Monocytes Relative: 5.9 %
Neutro Abs: 3903 {cells}/uL (ref 1500–7800)
Neutrophils Relative %: 57.4 %
Platelets: 280 10*3/uL (ref 140–400)
RBC: 4.43 10*6/uL (ref 3.80–5.10)
RDW: 11.8 % (ref 11.0–15.0)
Total Lymphocyte: 31.6 %
WBC: 6.8 10*3/uL (ref 3.8–10.8)

## 2023-01-13 LAB — B12 AND FOLATE PANEL
Folate: 4.4 ng/mL — ABNORMAL LOW
Vitamin B-12: 464 pg/mL (ref 200–1100)

## 2023-01-13 LAB — IRON,TIBC AND FERRITIN PANEL
%SAT: 26 % (ref 16–45)
Ferritin: 36 ng/mL (ref 16–154)
Iron: 80 ug/dL (ref 40–190)
TIBC: 306 ug/dL (ref 250–450)

## 2023-01-13 LAB — VITAMIN D 25 HYDROXY (VIT D DEFICIENCY, FRACTURES): Vit D, 25-Hydroxy: 36 ng/mL (ref 30–100)

## 2023-01-13 LAB — TSH: TSH: 1.37 m[IU]/L

## 2023-01-13 NOTE — Telephone Encounter (Signed)
Pt given lab results per notes of Raynelle Fanning, NP on 01/13/23. Pt verbalized understanding.   Overall labs look good.  Your folate is borderline low. You can increase this by eating things high in folate.  Which include: broccoli. brussels sprouts. leafy green vegetables, such as cabbage, kale, spring greens and spinach. peas. chickpeas and kidney beans. liver (avoid liver if you are pregnant) breakfast cereals fortified with folic acid.  Written by Berniece Salines, FNP on 01/13/2023  9:23 AM EDT

## 2023-01-18 ENCOUNTER — Ambulatory Visit: Payer: Medicaid Other | Attending: Cardiology | Admitting: Cardiology

## 2023-01-18 ENCOUNTER — Encounter: Payer: Self-pay | Admitting: Cardiology

## 2023-01-18 VITALS — BP 126/86 | HR 53 | Ht 72.0 in | Wt 196.2 lb

## 2023-01-18 DIAGNOSIS — Z7289 Other problems related to lifestyle: Secondary | ICD-10-CM | POA: Diagnosis not present

## 2023-01-18 DIAGNOSIS — R072 Precordial pain: Secondary | ICD-10-CM | POA: Diagnosis not present

## 2023-01-18 NOTE — Progress Notes (Signed)
Cardiology Office Note:    Date:  01/18/2023   ID:  Alexandra Solis, DOB 08/28/1995, MRN [id number]  PCP:  Berniece Salines, FNP   [facility] Providers Cardiologist:  Debbe Odea, MD     Referring MD: Berniece Salines, FNP   Chief Complaint  Patient presents with   New Patient (Initial Visit)    Referred for cardiac evaluation of SOBr, chest pressure, dizziness, and bradycardia by electrocardiogram.  Patient reports near syncope episodes.       History of Present Illness:    Alexandra Solis is a 27 y.o. female former bipolar disorder, smoker x 5 years, currently vapes who presents due to shortness of breath and chest pressure.  States having symptoms of shortness of breath and chest pressure with exertion over the past 3 to 4 months.  Her job involves walking, has found it difficult to perform her usual activities of late.  She is adopted, does not know of any family history.  Smoked in the past for about 5 years, currently vapes.  Has a history of hypertension, BP have been reasonably controlled off BP meds.  Past Medical History:  Diagnosis Date   Bipolar 1 disorder (HCC)    Bipolar 1 disorder (HCC)    Hypertension    Seizures (HCC)    Thyroid disease    hypo    Past Surgical History:  Procedure Laterality Date   TONSILLECTOMY      Current Medications: Current Meds  Medication Sig   cyclobenzaprine (FLEXERIL) 5 MG tablet Take 1-2 tablets (5-10 mg total) by mouth at bedtime.   meloxicam (MOBIC) 15 MG tablet Take 1 tablet (15 mg total) by mouth daily.     Allergies:   Seroquel [quetiapine fumarate] and Quetiapine   Social History   Socioeconomic History   Marital status: Single    Spouse name: Not on file   Number of children: Not on file   Years of education: Not on file   Highest education level: Not on file  Occupational History   Occupation: Conservation officer, nature at [employer]  Tobacco Use   Smoking status: Former    Types: E-cigarettes    Smokeless tobacco: Never  Vaping Use   Vaping status: Every Day   Substances: Nicotine, Flavoring  Substance and Sexual Activity   Alcohol use: Not Currently   Drug use: Yes    Types: Marijuana    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  Other Topics Concern   Not on file  Social History Narrative   Not on file   Social Determinants of Health   Financial Resource Strain: Low Risk  (10/06/2022)   Overall Financial Resource Strain (CARDIA)    Difficulty of Paying Living Expenses: Not hard at all  Food Insecurity: No Food Insecurity (10/06/2022)   Hunger Vital Sign    Worried About Running Out of Food in the Last Year: Never true    Ran Out of Food in the Last Year: Never true  Transportation Needs: No Transportation Needs (10/06/2022)   PRAPARE - Administrator, Civil Service (Medical): No    Lack of Transportation (Non-Medical): No  Physical Activity: Sufficiently Active (10/06/2022)   Exercise Vital Sign    Days of Exercise per Week: 6 days    Minutes of Exercise per Session: 150+ min  Stress: Stress Concern Present (10/06/2022)   [name] of Occupational Health - Occupational Stress Questionnaire    Feeling of Stress :  To some extent  Social Connections: Moderately Isolated (10/06/2022)   Social Connection and Isolation Panel [NHANES]    Frequency of Communication with Friends and Family: More than three times a week    Frequency of Social Gatherings with Friends and Family: More than three times a week    Attends Religious Services: More than 4 times per year    Active Member of [employer] or Organizations: No    Attends Banker Meetings: Never    Marital Status: Never married     Family History: The patient's family history includes Cancer in her father; Heart disease in her mother.  ROS:   Please see the history of present illness.     All other systems reviewed and are negative.  EKGs/Labs/Other Studies Reviewed:     The following studies were reviewed today:  EKG Interpretation Date/Time:  Wednesday January 18 2023 11:33:02 EDT Ventricular Rate:  53 PR Interval:  144 QRS Duration:  92 QT Interval:  430 QTC Calculation: 403 R Axis:   51  Text Interpretation: Sinus bradycardia Confirmed by Debbe Odea ([id number]) on 01/18/2023 11:45:49 AM    Recent Labs: 01/12/2023: ALT 13; BUN 14; Creat 1.03; Hemoglobin 13.8; Platelets 280; Potassium 4.7; Sodium 137; TSH 1.37  Recent Lipid Panel    Component Value Date/Time   CHOL 146 08/23/2022 1549   TRIG 83 08/23/2022 1549   HDL 50 08/23/2022 1549   CHOLHDL 2.9 08/23/2022 1549   VLDL 23 05/14/2020 1340   LDLCALC 79 08/23/2022 1549     Risk Assessment/Calculations:          Physical Exam:    VS:  BP 126/86 (BP Location: Left Arm, Patient Position: Sitting, Cuff Size: Normal)   Pulse (!) 53   Ht 6' (1.829 m)   Wt 196 lb 3.2 oz (89 kg)   LMP 01/04/2023   SpO2 99%   BMI 26.61 kg/m     Wt Readings from Last 3 Encounters:  01/18/23 196 lb 3.2 oz (89 kg)  01/12/23 197 lb 4.8 oz (89.5 kg)  10/06/22 197 lb 4.8 oz (89.5 kg)     GEN:  Well nourished, well developed in no acute distress HEENT: Normal NECK: No JVD; No carotid bruits CARDIAC: RRR, no murmurs, rubs, gallops RESPIRATORY:  Clear to auscultation without rales, wheezing or rhonchi  ABDOMEN: Soft, non-tender, non-distended MUSCULOSKELETAL:  No edema; No deformity  SKIN: Warm and dry NEUROLOGIC:  Alert and oriented x 3 PSYCHIATRIC:  Normal affect   ASSESSMENT:    1. Precordial pain   2. Engages in vaping    PLAN:    In order of problems listed above:  Chest pain, history of hypertension, mild smoking.  Obtain echo, obtain coronary CT. Currently vapes, cessation advised.  Follow-up after cardiac testing.  Reassure patient if testing is normal.      Medication Adjustments/Labs and Tests Ordered: Current medicines are reviewed at length with the patient today.   Concerns regarding medicines are outlined above.  Orders Placed This Encounter  Procedures   CT CORONARY MORPH W/CTA COR W/SCORE W/CA W/CM &/OR WO/CM   Basic Metabolic Panel (BMET)   EKG 12-Lead   ECHOCARDIOGRAM COMPLETE   No orders of the defined types were placed in this encounter.   Patient Instructions  Medication Instructions:   Your physician recommends that you continue on your current medications as directed. Please refer to the Current Medication list given to you today.  *If you need a refill on your cardiac  medications before your next appointment, please call your pharmacy*   Lab Work:  Your physician recommends you have lab work - BMP  If you have labs (blood work) drawn today and your tests are completely normal, you will receive your results only by: MyChart Message (if you have MyChart) OR A paper copy in the mail If you have any lab test that is abnormal or we need to change your treatment, we will call you to review the results.   Testing/Procedures:  Your physician has requested that you have an echocardiogram. Echocardiography is a painless test that uses sound waves to create images of your heart. It provides your doctor with information about the size and shape of your heart and how well your heart's chambers and valves are working. This procedure takes approximately one hour. There are no restrictions for this procedure. Please do NOT wear cologne, perfume, aftershave, or lotions (deodorant is allowed). Please arrive 15 minutes prior to your appointment time.    Your cardiac CT will be scheduled at one of the below locations:   [facility] [address] [place], [place] [address] [phone]  OR   [facility] [address] [place], [place] [address] [phone]  Please follow these instructions carefully (unless otherwise directed):  An IV will be required for this  test and Nitroglycerin will be given.  Hold all erectile dysfunction medications at least 3 days (72 hrs) prior to test. (Ie viagra, cialis, sildenafil, tadalafil, etc)   On the Night Before the Test: Be sure to Drink plenty of water. Do not consume any caffeinated/decaffeinated beverages or chocolate 12 hours prior to your test. Do not take any antihistamines 12 hours prior to your test.  On the Day of the Test: Drink plenty of water until 1 hour prior to the test. Do not eat any food 1 hour prior to test. You may take your regular medications prior to the test. FEMALES- please wear underwire-free bra if available, avoid dresses & tight clothing      After the Test: Drink plenty of water. After receiving IV contrast, you may experience a mild flushed feeling. This is normal. On occasion, you may experience a mild rash up to 24 hours after the test. This is not dangerous. If this occurs, you can take Benadryl 25 mg and increase your fluid intake. If you experience trouble breathing, this can be serious. If it is severe call 911 IMMEDIATELY. If it is mild, please call our office. If you take any of these medications: Glipizide/Metformin, Avandament, Glucavance, please do not take 48 hours after completing test unless otherwise instructed.  We will call to schedule your test 2-4 weeks out understanding that some insurance companies will need an authorization prior to the service being performed.   For more information and frequently asked questions, please visit our website : [email or web address]  For non-scheduling related questions, please contact the cardiac imaging nurse navigator should you have any questions/concerns: Cardiac Imaging Nurse Navigators Direct Office Dial: [phone]   For scheduling needs, including cancellations and rescheduling, please call [place], [phone].   Follow-Up: At [facility], you and your health needs are our priority.  As  part of our continuing mission to provide you with exceptional heart care, we have created designated Provider Care Teams.  These Care Teams include your primary Cardiologist (physician) and Advanced Practice Providers (APPs -  Physician Assistants and Nurse Practitioners) who all work together to  provide you with the care you need, when you need it.  We recommend signing up for the patient portal called "MyChart".  Sign up information is provided on this After Visit Summary.  MyChart is used to connect with patients for Virtual Visits (Telemedicine).  Patients are able to view lab/test results, encounter notes, upcoming appointments, etc.  Non-urgent messages can be sent to your provider as well.   To learn more about what you can do with MyChart, go to [email or web address].    Your next appointment:    After Cardiac testing  Provider:   You may see Debbe Odea, MD or one of the following Advanced Practice Providers on your designated Care Team:   Nicolasa Ducking, NP Eula Listen, PA-C Cadence Fransico Michael, PA-C Charlsie Quest, NP   Signed, Debbe Odea, MD  01/18/2023 12:15 PM    [facility]

## 2023-01-18 NOTE — Patient Instructions (Signed)
Medication Instructions:   Your physician recommends that you continue on your current medications as directed. Please refer to the Current Medication list given to you today.  *If you need a refill on your cardiac medications before your next appointment, please call your pharmacy*   Lab Work:  Your physician recommends you have lab work - BMP  If you have labs (blood work) drawn today and your tests are completely normal, you will receive your results only by: MyChart Message (if you have MyChart) OR A paper copy in the mail If you have any lab test that is abnormal or we need to change your treatment, we will call you to review the results.   Testing/Procedures:  Your physician has requested that you have an echocardiogram. Echocardiography is a painless test that uses sound waves to create images of your heart. It provides your doctor with information about the size and shape of your heart and how well your heart's chambers and valves are working. This procedure takes approximately one hour. There are no restrictions for this procedure. Please do NOT wear cologne, perfume, aftershave, or lotions (deodorant is allowed). Please arrive 15 minutes prior to your appointment time.    Your cardiac CT will be scheduled at one of the below locations:   [facility] [address] [place], [place] [address] [phone]  OR   [facility] entrance [address] [place], [place] [address] [phone]  Please follow these instructions carefully (unless otherwise directed):  An IV will be required for this test and Nitroglycerin will be given.  Hold all erectile dysfunction medications at least 3 days (72 hrs) prior to test. (Ie viagra, cialis, sildenafil, tadalafil, etc)   On the Night Before the Test: Be sure to Drink plenty of water. Do not consume any caffeinated/decaffeinated beverages or chocolate 12 hours prior  to your test. Do not take any antihistamines 12 hours prior to your test.  On the Day of the Test: Drink plenty of water until 1 hour prior to the test. Do not eat any food 1 hour prior to test. You may take your regular medications prior to the test. FEMALES- please wear underwire-free bra if available, avoid dresses & tight clothing      After the Test: Drink plenty of water. After receiving IV contrast, you may experience a mild flushed feeling. This is normal. On occasion, you may experience a mild rash up to 24 hours after the test. This is not dangerous. If this occurs, you can take Benadryl 25 mg and increase your fluid intake. If you experience trouble breathing, this can be serious. If it is severe call 911 IMMEDIATELY. If it is mild, please call our office. If you take any of these medications: Glipizide/Metformin, Avandament, Glucavance, please do not take 48 hours after completing test unless otherwise instructed.  We will call to schedule your test 2-4 weeks out understanding that some insurance companies will need an authorization prior to the service being performed.   For more information and frequently asked questions, please visit our website : [email or web address]  For non-scheduling related questions, please contact the cardiac imaging nurse navigator should you have any questions/concerns: Cardiac Imaging Nurse Navigators Direct Office Dial: [phone]   For scheduling needs, including cancellations and rescheduling, please call [place], [phone].   Follow-Up: At [facility], you and your health needs are our priority.  As part of our continuing mission to provide you with exceptional  heart care, we have created designated Provider Care Teams.  These Care Teams include your primary Cardiologist (physician) and Advanced Practice Providers (APPs -  Physician Assistants and Nurse Practitioners) who all work together to provide you with the  care you need, when you need it.  We recommend signing up for the patient portal called "MyChart".  Sign up information is provided on this After Visit Summary.  MyChart is used to connect with patients for Virtual Visits (Telemedicine).  Patients are able to view lab/test results, encounter notes, upcoming appointments, etc.  Non-urgent messages can be sent to your provider as well.   To learn more about what you can do with MyChart, go to [email or web address].    Your next appointment:    After Cardiac testing  Provider:   You may see Debbe Odea, MD or one of the following Advanced Practice Providers on your designated Care Team:   Nicolasa Ducking, NP Eula Listen, PA-C Cadence Fransico Michael, PA-C Charlsie Quest, NP

## 2023-01-19 ENCOUNTER — Ambulatory Visit: Payer: BLUE CROSS/BLUE SHIELD | Admitting: Sports Medicine

## 2023-01-19 LAB — BASIC METABOLIC PANEL
BUN/Creatinine Ratio: 13 (ref 9–23)
BUN: 11 mg/dL (ref 6–20)
CO2: 23 mmol/L (ref 20–29)
Calcium: 9.3 mg/dL (ref 8.7–10.2)
Chloride: 103 mmol/L (ref 96–106)
Creatinine, Ser: 0.87 mg/dL (ref 0.57–1.00)
Glucose: 56 mg/dL — ABNORMAL LOW (ref 70–99)
Potassium: 4.3 mmol/L (ref 3.5–5.2)
Sodium: 139 mmol/L (ref 134–144)
eGFR: 94 mL/min/{1.73_m2} (ref >59)

## 2023-02-02 ENCOUNTER — Ambulatory Visit: Admission: RE | Admit: 2023-02-02 | Payer: BLUE CROSS/BLUE SHIELD | Source: Ambulatory Visit

## 2023-02-02 ENCOUNTER — Other Ambulatory Visit: Payer: BLUE CROSS/BLUE SHIELD

## 2023-02-09 ENCOUNTER — Ambulatory Visit: Admission: RE | Admit: 2023-02-09 | Payer: BLUE CROSS/BLUE SHIELD | Source: Ambulatory Visit

## 2023-02-21 ENCOUNTER — Ambulatory Visit: Payer: BLUE CROSS/BLUE SHIELD | Admitting: Nurse Practitioner

## 2023-02-22 NOTE — Progress Notes (Deleted)
Cardiology Office Note    Date:  02/22/2023   ID:  Alexandra Solis, DOB 1995/12/01, MRN [id number]  PCP:  Berniece Salines, FNP  Cardiologist:  Debbe Odea, MD  Electrophysiologist:  None   Chief Complaint: Follow up  History of Present Illness:   Alexandra Solis is a 27 y.o. female with history of ***  2D echo and coronary CTA pending.  ***   Labs independently reviewed: 01/2023 - BUN 11, serum creatinine 0.87, potassium 4.3, Hgb 13.8, PLT 280, TSH normal, albumin 4.4, AST/ALT normal 08/2022 - A1c 5.4, TC 146, TG 83, HDL 50, LDL 79  Past Medical History:  Diagnosis Date   Bipolar 1 disorder (HCC)    Bipolar 1 disorder (HCC)    Hypertension    Seizures (HCC)    Thyroid disease    hypo    Past Surgical History:  Procedure Laterality Date   TONSILLECTOMY      Current Medications: No outpatient medications have been marked as taking for the 02/23/23 encounter (Appointment) with Sondra Barges, PA-C.    Allergies:   Seroquel [quetiapine fumarate] and Quetiapine   Social History   Socioeconomic History   Marital status: Single    Spouse name: Not on file   Number of children: Not on file   Years of education: Not on file   Highest education level: Not on file  Occupational History   Occupation: Conservation officer, nature at [employer]  Tobacco Use   Smoking status: Former    Types: E-cigarettes   Smokeless tobacco: Never  Vaping Use   Vaping status: Every Day   Substances: Nicotine, Flavoring  Substance and Sexual Activity   Alcohol use: Not Currently   Drug use: Yes    Types: Marijuana    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  Other Topics Concern   Not on file  Social History Narrative   Not on file   Social Determinants of Health   Financial Resource Strain: Low Risk  (10/06/2022)   Overall Financial Resource Strain (CARDIA)    Difficulty of Paying Living Expenses: Not hard at all  Food Insecurity: No Food Insecurity  (10/06/2022)   Hunger Vital Sign    Worried About Running Out of Food in the Last Year: Never true    Ran Out of Food in the Last Year: Never true  Transportation Needs: No Transportation Needs (10/06/2022)   PRAPARE - Administrator, Civil Service (Medical): No    Lack of Transportation (Non-Medical): No  Physical Activity: Sufficiently Active (10/06/2022)   Exercise Vital Sign    Days of Exercise per Week: 6 days    Minutes of Exercise per Session: 150+ min  Stress: Stress Concern Present (10/06/2022)   [name] of Occupational Health - Occupational Stress Questionnaire    Feeling of Stress : To some extent  Social Connections: Moderately Isolated (10/06/2022)   Social Connection and Isolation Panel [NHANES]    Frequency of Communication with Friends and Family: More than three times a week    Frequency of Social Gatherings with Friends and Family: More than three times a week    Attends Religious Services: More than 4 times per year    Active Member of [employer] or Organizations: No    Attends Banker Meetings: Never    Marital Status: Never married     Family History:  The patient's family history includes Cancer in her father; Heart disease in her  mother.  ROS:   12-point review of systems is negative unless otherwise noted in the HPI.   EKGs/Labs/Other Studies Reviewed:    Studies reviewed were summarized above. The additional studies were reviewed today:  2D echo and coronary CTA pending   EKG:  EKG is ordered today.  The EKG ordered today demonstrates ***  Recent Labs: 01/12/2023: ALT 13; Hemoglobin 13.8; Platelets 280; TSH 1.37 01/18/2023: BUN 11; Creatinine, Ser 0.87; Potassium 4.3; Sodium 139  Recent Lipid Panel    Component Value Date/Time   CHOL 146 08/23/2022 1549   TRIG 83 08/23/2022 1549   HDL 50 08/23/2022 1549   CHOLHDL 2.9 08/23/2022 1549   VLDL 23 05/14/2020 1340   LDLCALC 79 08/23/2022 1549    PHYSICAL EXAM:    VS:   There were no vitals taken for this visit.  BMI: There is no height or weight on file to calculate BMI.  Physical Exam  Wt Readings from Last 3 Encounters:  01/18/23 196 lb 3.2 oz (89 kg)  01/12/23 197 lb 4.8 oz (89.5 kg)  10/06/22 197 lb 4.8 oz (89.5 kg)     ASSESSMENT & PLAN:   ***   {Are you ordering a CV Procedure (e.g. stress test, cath, DCCV, TEE, etc)?   Press F2        :[id number]}     Disposition: F/u with Dr. Azucena Cecil or an APP in ***.   Medication Adjustments/Labs and Tests Ordered: Current medicines are reviewed at length with the patient today.  Concerns regarding medicines are outlined above. Medication changes, Labs and Tests ordered today are summarized above and listed in the Patient Instructions accessible in Encounters.   Signed, Eula Listen, PA-C 02/22/2023 8:16 AM     [facility] -  [address] [place], [place] [address] [phone]

## 2023-02-23 ENCOUNTER — Ambulatory Visit: Payer: BLUE CROSS/BLUE SHIELD | Admitting: Physician Assistant

## 2023-03-02 ENCOUNTER — Ambulatory Visit: Payer: BLUE CROSS/BLUE SHIELD | Admitting: Cardiology

## 2023-03-06 ENCOUNTER — Telehealth: Payer: Self-pay

## 2023-03-06 NOTE — Telephone Encounter (Signed)
Called patient to follow up on the below message   FW: Denied Echocardiogram Armed forces training and education officer) Received: 1 month ago Conservator, museum/gallery, Jordan, CMA  Gerringer, Areanna Can you please call patient and make her aware that her echo will need to be done in [place] or [facility]       Previous Messages    ----- Message ----- From: Debbe Odea, MD Sent: 01/20/2023   6:56 PM EDT To: Laurita Quint, CMA; * Subject: RE: Denied Echocardiogram (Out of Network)    Okay for patient to go to either facility. ----- Message ----- From: Ursula Alert Sent: 01/20/2023  10:56 AM EDT To: Laurita Quint, CMA; * Subject: Denied Echocardiogram (Out of Network)        Good Morning,  Hope all is well; this patient is scheduled for an echocardiogram on 02/02/2023. [employer] denied the authorization request due to [facility] facility being out of network for the patient's policy. If the patient decides to move forward with the exam, they will be considered self-pay (will not qualify for the self-pay discount) and the insurance will not be filed. The patient will be responsible for the cost of the exam.  I found two facilities that are in-network for the policy:  [facility]: [address], [place] [address] [facility]: [address] [place], [place] [address]  If the patient decides to go to either facility, please let me know so I can submit the authorization request.  The patient can contact member services at [phone] to get more information on the facilities that are part of their policy's network.  Thank you,  Ihor Gully

## 2023-03-09 ENCOUNTER — Encounter: Payer: BLUE CROSS/BLUE SHIELD | Admitting: Obstetrics and Gynecology

## 2023-03-20 ENCOUNTER — Telehealth: Payer: Self-pay | Admitting: Cardiology

## 2023-03-20 NOTE — Telephone Encounter (Signed)
Unable to contact patient to cancel. Voicemail full

## 2023-03-20 NOTE — Telephone Encounter (Signed)
-----   Message from [facility] sent at 03/20/2023  9:26 AM EST ----- Regarding: 03/23/2023 appointment with Guido Sander Morning Martie Lee,  This patient is scheduled to see Sheri on 03/23/2023. Dr. Myriam Forehand placed an order for the patient to have an echo and CT.  [employer]BS denied the authorization request due to [facility] facility being out of network for the patient's policy. Lavonna Rua states that maybe the patient should see a provider in [place] or [place] where she is covered if [facility] is out of network. Lavonna Rua suggested that maybe the patient needs to be called to inquire about her insurance. The visit for Thursday maybe needs to be cancelled since test hasn't been complete due to her insurance being out of network.   Thank you so much, Sherena.

## 2023-03-23 ENCOUNTER — Ambulatory Visit: Payer: BLUE CROSS/BLUE SHIELD | Admitting: Cardiology

## 2023-03-23 ENCOUNTER — Ambulatory Visit (HOSPITAL_BASED_OUTPATIENT_CLINIC_OR_DEPARTMENT_OTHER): Payer: Self-pay | Admitting: Psychiatry

## 2023-03-23 ENCOUNTER — Encounter (HOSPITAL_COMMUNITY): Payer: Self-pay

## 2023-03-23 DIAGNOSIS — Z91199 Patient's noncompliance with other medical treatment and regimen due to unspecified reason: Secondary | ICD-10-CM

## 2023-03-23 NOTE — Telephone Encounter (Signed)
Contacted patient to inform we are OON. Patient decided to cancel appointment for today and I provided the number [phone] to get more information on the facilities that are part of her policy's network.

## 2023-03-23 NOTE — Progress Notes (Signed)
No show

## 2023-03-24 ENCOUNTER — Ambulatory Visit: Payer: BLUE CROSS/BLUE SHIELD | Admitting: Cardiology

## 2023-03-29 ENCOUNTER — Ambulatory Visit: Payer: BLUE CROSS/BLUE SHIELD | Admitting: Physician Assistant

## 2023-03-29 NOTE — Progress Notes (Deleted)
Alexandra Amy, PA-C [address]  [address]  [place], [place] [address]  Main: [phone]  Fax: [phone]   Gastroenterology Consultation  Referring Provider:     Berniece Salines, FNP Primary Care Physician:  Berniece Salines, FNP Primary Gastroenterologist:  *** Reason for Consultation:     Abdominal pain, nausea        HPI:   [name] is a 27 y.o. y/o female referred for consultation & management  by Berniece Salines, FNP.    History of bipolar disorder and smoker x 5 years.  Currently vaping.  Former marijuana use.  She saw cardiology 01/18/2023 to evaluate shortness of breath and chest pressure.  Patient is adopted.  Family history unknown.  Echocardiogram and coronary CT were ordered, yet not completed. She takes meloxicam 15 Mg daily.  Recent chest x-ray normal.  Current symptoms:  01/2023 labs: Normal CBC, CMP, TSH, iron, B12, folate.  Hemoglobin 13.8.  In 2018 she had workup for abdominal pain.  Normal HIDA scan.  Normal abdominal pelvic CT.  In 2019 gallbladder ultrasound normal.  No previous GI evaluation, EGD, or colonoscopy.  Past Medical History:  Diagnosis Date   Bipolar 1 disorder (HCC)    Bipolar 1 disorder (HCC)    Hypertension    Seizures (HCC)    Thyroid disease    hypo    Past Surgical History:  Procedure Laterality Date   TONSILLECTOMY      Prior to Admission medications   Medication Sig Start Date End Date Taking? Authorizing Provider  cyclobenzaprine (FLEXERIL) 5 MG tablet Take 1-2 tablets (5-10 mg total) by mouth at bedtime. 12/16/22   Madelyn Brunner, DO  meloxicam (MOBIC) 15 MG tablet Take 1 tablet (15 mg total) by mouth daily. 10/04/22   Madelyn Brunner, DO    Family History  Problem Relation Age of Onset   Heart disease Mother    Cancer Father      Social History   Tobacco Use   Smoking status: Former    Types: E-cigarettes   Smokeless tobacco: Never  Vaping Use   Vaping status: Every Day   Substances: Nicotine,  Flavoring  Substance Use Topics   Alcohol use: Not Currently   Drug use: Yes    Types: Marijuana    Comment: Former Marijuana User    Allergies as of 03/29/2023 - Review Complete 01/18/2023  Allergen Reaction Noted   Seroquel [quetiapine fumarate] Other (See Comments) 05/04/2014   Quetiapine  10/05/2012    Review of Systems:    All systems reviewed and negative except where noted in HPI.   Physical Exam:  There were no vitals taken for this visit. No LMP recorded.  General:   Alert,  Well-developed, well-nourished, pleasant and cooperative in NAD Lungs:  Respirations even and unlabored.  Clear throughout to auscultation.   No wheezes, crackles, or rhonchi. No acute distress. Heart:  Regular rate and rhythm; no murmurs, clicks, rubs, or gallops. Abdomen:  Normal bowel sounds.  No bruits.  Soft, and non-distended without masses, hepatosplenomegaly or hernias noted.  No Tenderness.  No guarding or rebound tenderness.    Neurologic:  Alert and oriented x3;  grossly normal neurologically. Psych:  Alert and cooperative. Normal mood and affect.  Imaging Studies: No results found.  Assessment and Plan:   [name] is a 27 y.o. y/o female has been referred for   1.  Epigastric pain  H. pylori test  Start PPI  2.  Nausea  Stop vaping and marijuana  3.  Abdominal pain: Chronic and intermittent.  Recent normal labs.  Normal abdominal ultrasound and CT in 2018 / 2019.  Follow up ***  Alexandra Amy, PA-C    BP check ***

## 2023-10-12 ENCOUNTER — Encounter: Payer: BLUE CROSS/BLUE SHIELD | Admitting: Nurse Practitioner

## 2023-10-12 ENCOUNTER — Encounter: Payer: Medicaid Other | Admitting: Nurse Practitioner

## 2023-10-12 NOTE — Progress Notes (Deleted)
 Name: Alexandra Solis   MRN: [id number]    DOB: 1995/10/11   Date:10/12/2023       Progress Note  Subjective  Chief Complaint  No chief complaint on file.   HPI  Patient presents for annual CPE.  Diet: *** Exercise: ***  Sleep: *** Last dental exam:*** Last eye exam: ***  Flowsheet Row Office Visit from 01/12/2023 in [facility]  AUDIT-C Score 0      Depression: Phq 9 is  {Desc; negative/positive:13464}    01/12/2023    9:47 AM 10/06/2022    9:01 AM 08/23/2022    2:49 PM 05/14/2020   12:17 PM 07/10/2017    2:30 PM  Depression screen PHQ 2/9  Decreased Interest 0 3 3  1   Down, Depressed, Hopeless 0 3 3  2   PHQ - 2 Score 0 6 6  3   Altered sleeping  3 3  3   Tired, decreased energy  3 3  3   Change in appetite  3 3  2   Feeling bad or failure about yourself   3 2  3   Trouble concentrating  3 3  1   Moving slowly or fidgety/restless  3 3  2   Suicidal thoughts  0 0  1  PHQ-9 Score  24 23  18   Difficult doing work/chores   Extremely dIfficult  Somewhat difficult     Information is confidential and restricted. Go to Review Flowsheets to unlock data.   Hypertension: BP Readings from Last 3 Encounters:  01/18/23 126/86  01/12/23 110/72  10/06/22 110/72   Obesity: Wt Readings from Last 3 Encounters:  01/18/23 196 lb 3.2 oz (89 kg)  01/12/23 197 lb 4.8 oz (89.5 kg)  10/06/22 197 lb 4.8 oz (89.5 kg)   BMI Readings from Last 3 Encounters:  01/18/23 26.61 kg/m  01/12/23 27.13 kg/m  10/06/22 27.13 kg/m     Vaccines:  HPV: up to at age 4 , ask insurance if age between 59-45  Shingrix: 82-64 yo and ask insurance if covered when patient above 35 yo Pneumonia:  educated and discussed with patient. Flu:  educated and discussed with patient.  Hep C Screening: completed STD testing and prevention (HIV/chl/gon/syphilis): completed Intimate partner violence:*** Sexual History : Menstrual History/LMP/Abnormal Bleeding:  Incontinence Symptoms:    Breast cancer:  - Last Mammogram: does not qualify - BRCA gene screening: none  Osteoporosis: Discussed high calcium and vitamin D  supplementation, weight bearing exercises  Cervical cancer screening: due  Skin cancer: Discussed monitoring for atypical lesions  Colorectal cancer: does not qualify   Lung cancer:   Low Dose CT Chest recommended if Age 20-80 years, 20 pack-year currently smoking OR have quit w/in 15years. Patient does not qualify.   ECG: 01/18/2023  Advanced Care Planning: A voluntary discussion about advance care planning including the explanation and discussion of advance directives.  Discussed health care proxy and Living will, and the patient was able to identify a health care proxy as Aliza Greeson .  Patient {DOES_DOES WUJ:81191} have a living will at present time. If patient does have living will, I have requested they bring this to the clinic to be scanned in to their chart.  Lipids: Lab Results  Component Value Date   CHOL 146 08/23/2022   CHOL 163 05/14/2020   CHOL (H) 08/05/2009    176        ATP III CLASSIFICATION:  <200     mg/dL   Desirable  478-295  mg/dL  Borderline High  >=240    mg/dL   High          Lab Results  Component Value Date   HDL 50 08/23/2022   HDL 47 05/14/2020   HDL 32 (L) 08/05/2009   Lab Results  Component Value Date   LDLCALC 79 08/23/2022   LDLCALC 93 05/14/2020   LDLCALC (H) 08/05/2009    119        Total Cholesterol/HDL:CHD Risk Coronary Heart Disease Risk Table                     Men   Women  1/2 Average Risk   3.4   3.3  Average Risk       5.0   4.4  2 X Average Risk   9.6   7.1  3 X Average Risk  23.4   11.0        Use the calculated Patient Ratio above and the CHD Risk Table to determine the patient's CHD Risk.        ATP III CLASSIFICATION (LDL):  <100     mg/dL   Optimal  161-096  mg/dL   Near or Above                    Optimal  130-159  mg/dL   Borderline  045-409  mg/dL   High  >811     mg/dL    Very High   Lab Results  Component Value Date   TRIG 83 08/23/2022   TRIG 114 05/14/2020   TRIG 123 08/05/2009   Lab Results  Component Value Date   CHOLHDL 2.9 08/23/2022   CHOLHDL 3.5 05/14/2020   CHOLHDL 5.5 08/05/2009   No results found for: "LDLDIRECT"  Glucose: Glucose  Date Value Ref Range Status  01/18/2023 56 (L) 70 - 99 mg/dL Final  91/47/8295 621 (H) 65 - 99 mg/dL Final  30/86/5784 94 65 - 99 mg/dL Final   Glucose, Bld  Date Value Ref Range Status  01/12/2023 84 65 - 99 mg/dL Final    Comment:    .            Fasting reference interval .   08/23/2022 77 65 - 99 mg/dL Final    Comment:    .            Fasting reference interval .   05/14/2020 85 70 - 99 mg/dL Final    Comment:    Glucose reference range applies only to samples taken after fasting for at least 8 hours.   Glucose-Capillary  Date Value Ref Range Status  05/05/2014 99 70 - 99 mg/dL Final    Patient Active Problem List   Diagnosis Date Noted  . Low vitamin D  level 08/18/2020  . Bipolar 1 disorder (HCC)   . Disease of thyroid  gland 09/15/2016  . 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 Essential (primary) hypertension 04/21/2015  . Transient alteration of awareness 12/15/2014  . Chest pain 05/11/2014  . Anxiety 02/27/2014  . Depression 02/27/2014  . Involuntary movements 02/27/2014  . Panic attack 02/27/2014  . Syncope and collapse 02/27/2014  . Multiple thyroid  nodules 10/08/2012  . 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 TONSILLECTOMY      Family History  Problem Relation Age of Onset  . Heart disease Mother   . Cancer Father     Social History   Socioeconomic History  .  Marital status: Single    Spouse name: Not on file  . Number of children: Not on file  . Years of education: Not on file  . Highest education level: Not on file  Occupational History  . Occupation: Conservation officer, nature at [employer]  .  Smoking status: Former    Types: E-cigarettes  . Smokeless tobacco: Never  Vaping Use  . Vaping status: Every Day  . Substances: Nicotine, Flavoring  Substance and Sexual Activity  . Alcohol use: Not Currently  . 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 Sexual activity: Not Currently  Other Topics Concern  . Not on file  Social History Narrative  . Not on file   Social Drivers of Health   Financial Resource Strain: Low Risk  (10/06/2022)   Overall Financial Resource Strain (CARDIA)   . Difficulty of Paying Living Expenses: Not hard at all  Food Insecurity: No Food Insecurity (10/06/2022)   Hunger Vital Sign   . Worried About Programme researcher, broadcasting/film/video in the Last Year: Never true   . Ran Out of Food in the Last Year: Never true  Transportation Needs: No Transportation Needs (10/06/2022)   PRAPARE - Transportation   . Lack of Transportation (Medical): No   . Lack of Transportation (Non-Medical): No  Physical Activity: Sufficiently Active (10/06/2022)   Exercise Vital Sign   . Days of Exercise per Week: 6 days   . Minutes of Exercise per Session: 150+ min  Stress: Stress Concern Present (10/06/2022)   [name] of Occupational Health - Occupational Stress Questionnaire   . Feeling of Stress : To some extent  Social Connections: Moderately Isolated (10/06/2022)   Social Connection and Isolation Panel [NHANES]   . Frequency of Communication with Friends and Family: More than three times a week   . Frequency of Social Gatherings with Friends and Family: More than three times a week   . Attends Religious Services: More than 4 times per year   . Active Member of Clubs or Organizations: No   . Attends Banker Meetings: Never   . Marital Status: Never married  Intimate Partner Violence: Not At Risk (10/06/2022)   Humiliation, Afraid, Rape, and Kick questionnaire   . Fear of Current or Ex-Partner: No   . Emotionally Abused: No   . Physically  Abused: No   . Sexually Abused: No     Current Outpatient Medications:  .  cyclobenzaprine  (FLEXERIL ) 5 MG tablet, Take 1-2 tablets (5-10 mg total) by mouth at bedtime., Disp: 45 tablet, Rfl: 0 .  meloxicam  (MOBIC ) 15 MG tablet, Take 1 tablet (15 mg total) by mouth daily., Disp: 30 tablet, Rfl: 1  Allergies  Allergen Reactions  . Seroquel [Quetiapine Fumarate] Other (See Comments)    Behavorial changes  . Quetiapine     Other reaction(s): Unknown Other reaction(s): Other (See Comments) Behavioral changes     ROS  Constitutional: Negative for fever or weight change.  Respiratory: Negative for cough and shortness of breath.   Cardiovascular: Negative for chest pain or palpitations.  Gastrointestinal: Negative for abdominal pain, no bowel changes.  Musculoskeletal: Negative for gait problem or joint swelling.  Skin: Negative for rash.  Neurological: Negative for dizziness or headache.  No other specific complaints in a complete review of systems (except as listed in HPI above).   Objective  There were no vitals filed for this visit.  There is no height  or weight on file to calculate BMI.  Physical Exam Vitals reviewed.  Constitutional:      Appearance: Normal appearance.  HENT:     Head: Normocephalic.     Right Ear: Tympanic membrane normal.     Left Ear: Tympanic membrane normal.     Nose: Nose normal.  Eyes:     Extraocular Movements: Extraocular movements intact.     Conjunctiva/sclera: Conjunctivae normal.     Pupils: Pupils are equal, round, and reactive to light.  Neck:     Thyroid : No thyroid  mass, thyromegaly or thyroid  tenderness.  Cardiovascular:     Rate and Rhythm: Normal rate and regular rhythm.     Pulses: Normal pulses.     Heart sounds: Normal heart sounds.  Pulmonary:     Effort: Pulmonary effort is normal.     Breath sounds: Normal breath sounds.  Abdominal:     General: Bowel sounds are normal.     Palpations: Abdomen is soft.   Musculoskeletal:        General: Normal range of motion.     Cervical back: Normal range of motion and neck supple.     Right lower leg: No edema.     Left lower leg: No edema.  Skin:    General: Skin is warm and dry.     Capillary Refill: Capillary refill takes less than 2 seconds.  Neurological:     General: No focal deficit present.     Mental Status: She is alert and oriented to person, place, and time. Mental status is at baseline.  Psychiatric:        Mood and Affect: Mood normal.        Behavior: Behavior normal.        Thought Content: Thought content normal.        Judgment: Judgment normal.    No results found for this or any previous visit (from the past 2160 hours).  Diabetic Foot Exam: Diabetic Foot Exam - Simple   No data filed    ***  Fall Risk:    01/12/2023    9:47 AM 10/06/2022    9:01 AM 08/23/2022    2:49 PM 07/10/2017    2:30 PM  Fall Risk   Falls in the past year? 0 0 0 No  Number falls in past yr: 0 0 0   Injury with Fall? 0 0 0    ***  Functional Status Survey:   ***  Assessment & Plan  There are no diagnoses linked to this encounter.  -USPSTF grade A and B recommendations reviewed with patient; age-appropriate recommendations, preventive care, screening tests, etc discussed and encouraged; healthy living encouraged; see AVS for patient education given to patient -Discussed importance of 150 minutes of physical activity weekly, eat two servings of fish weekly, eat one serving of tree nuts ( cashews, pistachios, pecans, almonds.Aaron Aas) every other day, eat 6 servings of fruit/vegetables daily and drink plenty of water and avoid sweet beverages.   -Reviewed Health Maintenance: ***

## 2023-11-21 ENCOUNTER — Telehealth: Payer: Self-pay | Admitting: Orthopedic Surgery

## 2023-11-21 NOTE — Telephone Encounter (Signed)
 This patient scheduled herself an appointment via mychart for 12/14/23 and this is what she put in the appointment note:   Patient comments: Severe back pain diagnosed with anterolythesis and degenerative disc disease about a year ago, locking in knees (mainly the right) and struggles to bend knees due to stiffness. I am at the point where it's drastically impacting my quality of life.  She has seen Lonell Sprang at [facility].  I tried to call the pt, but her voicemail is full.  I did respond to let her know I was cancelling her appointment and would consult with you and then call her back to advise.  Please advise.  Thanks,  [facility]

## 2023-11-21 NOTE — Telephone Encounter (Signed)
 Called the pt and advised, she verbalized understanding

## 2023-12-14 ENCOUNTER — Ambulatory Visit: Admitting: Orthopedic Surgery

## 2023-12-21 ENCOUNTER — Ambulatory Visit: Payer: Self-pay | Admitting: Nurse Practitioner

## 2023-12-21 ENCOUNTER — Encounter: Payer: Self-pay | Admitting: Nurse Practitioner

## 2023-12-21 VITALS — BP 128/85 | Ht 72.0 in | Wt 202.0 lb

## 2023-12-21 DIAGNOSIS — F419 Anxiety disorder, unspecified: Secondary | ICD-10-CM

## 2023-12-21 DIAGNOSIS — E663 Overweight: Secondary | ICD-10-CM

## 2023-12-21 DIAGNOSIS — E079 Disorder of thyroid, unspecified: Secondary | ICD-10-CM | POA: Diagnosis not present

## 2023-12-21 DIAGNOSIS — M51361 Other intervertebral disc degeneration, lumbar region with lower extremity pain only: Secondary | ICD-10-CM | POA: Diagnosis not present

## 2023-12-21 DIAGNOSIS — I10 Essential (primary) hypertension: Secondary | ICD-10-CM

## 2023-12-21 DIAGNOSIS — E042 Nontoxic multinodular goiter: Secondary | ICD-10-CM

## 2023-12-21 MED ORDER — HYDROXYZINE PAMOATE 25 MG PO CAPS: 25.0000 mg | ORAL_CAPSULE | Freq: Three times a day (TID) | ORAL | 0 refills | Status: DC | PRN

## 2023-12-21 MED ORDER — VENLAFAXINE HCL ER 37.5 MG PO CP24: 37.5000 mg | ORAL_CAPSULE | Freq: Every day | ORAL | 1 refills | Status: DC

## 2023-12-21 NOTE — Progress Notes (Signed)
 New Patient Office Visit  Subjective    Patient ID: Alexandra Solis, female    DOB: [birth year]  Age: 28 y.o. MRN: [id number]  CC:  Chief Complaint  Patient presents with   Establish Care    Pt here to establish care, pt has thyroid  hypothyroidism, and has been referred to a cardiology      HPI [name] presents to establish care Patient here today to establish primary care.  Last pap smear 3 years ago.  Last labs 1 year ago.  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  No levothyroxine.  Fasting for labs today.  Fatigue, SOB, chest pains bilaterally intermittently 10 days per month with exertion.  Hx of anxiety.    Outpatient Encounter Medications as of 12/21/2023  Medication Sig   cyclobenzaprine  (FLEXERIL ) 5 MG tablet Take 1-2 tablets (5-10 mg total) by mouth at bedtime.   meloxicam  (MOBIC ) 15 MG tablet Take 1 tablet (15 mg total) by mouth daily.   No facility-administered encounter medications on file as of 12/21/2023.    Past Medical History:  Diagnosis Date   Bipolar 1 disorder (HCC)    Bipolar 1 disorder (HCC)    Hypertension    Seizures (HCC)    Thyroid  disease    hypo    Past Surgical History:  Procedure Laterality Date   TONSILLECTOMY      Family History  Problem Relation Age of Onset   Heart disease Mother    Cancer Father     Social History   Socioeconomic History   Marital status: Single    Spouse name: Not on file   Number of children: Not on file   Years of education: Not on file   Highest education level: Not on file  Occupational History   Occupation: Conservation officer, nature at [employer]  Tobacco Use   Smoking status: Former    Types: E-cigarettes   Smokeless tobacco: Never  Vaping Use   Vaping status: Every Day   Substances: Nicotine, Flavoring  Substance and Sexual Activity   Alcohol use: Not Currently   Drug use: Yes    Types: Marijuana    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  Other Topics Concern    Not on file  Social History Narrative   Not on file   Social Drivers of Health   Financial Resource Strain: Low Risk  (10/06/2022)   Overall Financial Resource Strain (CARDIA)    Difficulty of Paying Living Expenses: Not hard at all  Food Insecurity: No Food Insecurity (10/06/2022)   Hunger Vital Sign    Worried About Running Out of Food in the Last Year: Never true    Ran Out of Food in the Last Year: Never true  Transportation Needs: No Transportation Needs (10/06/2022)   PRAPARE - Administrator, Civil Service (Medical): No    Lack of Transportation (Non-Medical): No  Physical Activity: Sufficiently Active (10/06/2022)   Exercise Vital Sign    Days of Exercise per Week: 6 days    Minutes of Exercise per Session: 150+ min  Stress: Stress Concern Present (10/06/2022)   [name] of Occupational Health - Occupational Stress Questionnaire    Feeling of Stress : To some extent  Social Connections: Moderately Isolated (10/06/2022)   Social Connection and Isolation Panel    Frequency of Communication with Friends and Family: More than three times a week    Frequency of Social Gatherings with Friends and  Family: More than three times a week    Attends Religious Services: More than 4 times per year    Active Member of Clubs or Organizations: No    Attends Banker Meetings: Never    Marital Status: Never married  Intimate Partner Violence: Not At Risk (10/06/2022)   Humiliation, Afraid, Rape, and Kick questionnaire    Fear of Current or Ex-Partner: No    Emotionally Abused: No    Physically Abused: No    Sexually Abused: No    ROS      Objective    BP 128/85   Ht 6' (1.829 m)   Wt 202 lb 0.3 oz (91.6 kg)   BMI 27.40 kg/m   Physical Exam Vitals and nursing note reviewed.  Constitutional:      Appearance: Normal appearance.  HENT:     Head: Normocephalic.     Nose: Nose normal.     Mouth/Throat:     Mouth: Mucous membranes are moist.   Cardiovascular:     Rate and Rhythm: Normal rate and regular rhythm.     Pulses: Normal pulses.     Heart sounds: Normal heart sounds.  Pulmonary:     Effort: Pulmonary effort is normal.     Breath sounds: Normal breath sounds.  Musculoskeletal:        General: Normal range of motion.     Cervical back: Normal range of motion and neck supple.  Skin:    General: Skin is warm and dry.  Neurological:     Mental Status: She is alert and oriented to person, place, and time.  Psychiatric:        Mood and Affect: Mood normal.        Behavior: Behavior normal.         Assessment & Plan: 1) Thyroid  disorders 2) Fatigue 3) Back pain 4) Anxiety  1) Fasting labs today 2) MRI Lumbar spine 3) Venlafaxine  37.5 mg daily in the AM 4) Hydroxyzine  as needed for moments of high anxiety 5) Follow up appt in 1 month   Problem List Items Addressed This Visit   None   No follow-ups on file.   Neale Carpen, NP

## 2023-12-21 NOTE — Patient Instructions (Signed)
 1) Fasting labs today 2) MRI Lumbar spine 3) Venlafaxine  37.5 mg daily in the AM 4) Hydroxyzine  as needed for moments of high anxiety 5) Follow up appt in 1 month

## 2023-12-22 ENCOUNTER — Ambulatory Visit: Payer: Self-pay

## 2023-12-22 LAB — CMP14+EGFR
ALT: 13 IU/L (ref 0–32)
AST: 20 IU/L (ref 0–40)
Albumin: 4.5 g/dL (ref 4.0–5.0)
Alkaline Phosphatase: 74 IU/L (ref 44–121)
BUN/Creatinine Ratio: 13 (ref 9–23)
BUN: 11 mg/dL (ref 6–20)
Bilirubin Total: 0.3 mg/dL (ref 0.0–1.2)
CO2: 19 mmol/L — ABNORMAL LOW (ref 20–29)
Calcium: 9.5 mg/dL (ref 8.7–10.2)
Chloride: 104 mmol/L (ref 96–106)
Creatinine, Ser: 0.87 mg/dL (ref 0.57–1.00)
Globulin, Total: 2.5 g/dL (ref 1.5–4.5)
Glucose: 100 mg/dL — ABNORMAL HIGH (ref 70–99)
Potassium: 4.2 mmol/L (ref 3.5–5.2)
Sodium: 139 mmol/L (ref 134–144)
Total Protein: 7 g/dL (ref 6.0–8.5)
eGFR: 94 mL/min/1.73 (ref >59)

## 2023-12-22 LAB — LIPID PANEL
Chol/HDL Ratio: 3.4 ratio (ref 0.0–4.4)
Cholesterol, Total: 143 mg/dL (ref 100–199)
HDL: 42 mg/dL (ref >39)
LDL Chol Calc (NIH): 90 mg/dL (ref 0–99)
Triglycerides: 49 mg/dL (ref 0–149)
VLDL Cholesterol Cal: 11 mg/dL (ref 5–40)

## 2023-12-22 LAB — TSH+FREE T4
Free T4: 1.17 ng/dL (ref 0.82–1.77)
TSH: 0.771 u[IU]/mL (ref 0.450–4.500)

## 2024-01-01 ENCOUNTER — Ambulatory Visit: Payer: Self-pay

## 2024-01-01 NOTE — Telephone Encounter (Signed)
 Patient scheduled.

## 2024-01-01 NOTE — Telephone Encounter (Signed)
 FYI Only or Action Required?: Action required by provider: request for appointment.  Patient was last seen in primary care on 12/21/2023 by Glennon Sand, NP.  Called Nurse Triage reporting No chief complaint on file..  Symptoms began several months ago.  Interventions attempted: Rest, hydration, or home remedies.  Symptoms are: gradually worsening.  Triage Disposition: See Physician Within 24 Hours  Patient/caregiver understands and will follow disposition?: YesCopied from CRM #[id number]. Topic: Clinical - Red Word Triage >> Jan 01, 2024 11:44 AM Charlet HERO wrote: Red Word that prompted transfer to Nurse Triage: The patient is calling about a tightness in her throat she is stating that she has history of thyroid  goarters. She has been having it all this weekend and feels worst when she lays currently feeling tight. Reason for Disposition  [1] Symptoms of pill stuck in throat or esophagus (e.g., pain in throat or chest, FB sensation) AND [2] no relief after using Care Advice  Answer Assessment - Initial Assessment Questions I feel like my throat is obstructed. Constant urge to clear throat and that doesn't help. This has been coming and going for last 3 months but has stayed since weekend.  Pt has hx of thyroid  goiters with 3 biopsy. Last goiter was 2021. Pt saw PCP last week but wasn't feeling this and didn't mention it. Denies trouble breathing.     1. DESCRIPTION: Tell me more about this problem. Are you  having trouble swallowing liquids, solids, or both? Any trouble with swallowing saliva (spit)?     Constant pressure trying to choke me  2. SEVERITY: How bad is the swallowing difficulty?  (Scale 1-10; or mild, moderate, severe)     4-5 3. ONSET: When did the swallowing problems begin?      Since weekend 4. CAUSE: What do you think is causing the problem?  (e.g., dry mouth, food or pill stuck in throat, mouth pain, sore throat, progression of disease process such as  dementia or Parkinson's disease).      Possible thyroid  goiter  5. CHRONIC or RECURRENT: Is this a new problem for you?  If No, ask: How long have you had this problem? (e.g., days, weeks, months)      Recurrent  6. OTHER SYMPTOMS: Do you have any other symptoms? (e.g., chest pain, difficulty breathing, mouth sores, sore throat, swollen tongue, chest pain)     denies 7. PREGNANCY: Is there any chance you are pregnant? When was your last menstrual period?     na  Protocols used: Swallowing Difficulty-A-AH

## 2024-01-02 ENCOUNTER — Emergency Department (HOSPITAL_COMMUNITY)
Admission: EM | Admit: 2024-01-02 | Discharge: 2024-01-03 | Disposition: A | Discharge status: Home / Self Care | Attending: Emergency Medicine | Admitting: Emergency Medicine

## 2024-01-02 ENCOUNTER — Emergency Department (HOSPITAL_COMMUNITY)

## 2024-01-02 ENCOUNTER — Encounter (HOSPITAL_COMMUNITY): Payer: Self-pay | Admitting: *Deleted

## 2024-01-02 ENCOUNTER — Other Ambulatory Visit: Payer: Self-pay

## 2024-01-02 ENCOUNTER — Ambulatory Visit: Payer: Self-pay | Admitting: Nurse Practitioner

## 2024-01-02 DIAGNOSIS — R09A2 Foreign body sensation, throat: Secondary | ICD-10-CM | POA: Insufficient documentation

## 2024-01-02 DIAGNOSIS — I10 Essential (primary) hypertension: Secondary | ICD-10-CM | POA: Diagnosis not present

## 2024-01-02 LAB — RESP PANEL BY RT-PCR (RSV, FLU A&B, COVID)  RVPGX2
Influenza A by PCR: NEGATIVE
Influenza B by PCR: NEGATIVE
Resp Syncytial Virus by PCR: NEGATIVE
SARS Coronavirus 2 by RT PCR: NEGATIVE

## 2024-01-02 LAB — CBC WITH DIFFERENTIAL/PLATELET
Abs Immature Granulocytes: 0.02 K/uL (ref 0.00–0.07)
Basophils Absolute: 0.1 K/uL (ref 0.0–0.1)
Basophils Relative: 1 %
Eosinophils Absolute: 0.4 K/uL (ref 0.0–0.5)
Eosinophils Relative: 5 %
HCT: 40.9 % (ref 36.0–46.0)
Hemoglobin: 13.9 g/dL (ref 12.0–15.0)
Immature Granulocytes: 0 %
Lymphocytes Relative: 37 %
Lymphs Abs: 3.2 K/uL (ref 0.7–4.0)
MCH: 31.5 pg (ref 26.0–34.0)
MCHC: 34 g/dL (ref 30.0–36.0)
MCV: 92.7 fL (ref 80.0–100.0)
Monocytes Absolute: 0.5 K/uL (ref 0.1–1.0)
Monocytes Relative: 6 %
Neutro Abs: 4.3 K/uL (ref 1.7–7.7)
Neutrophils Relative %: 51 %
Platelets: 288 K/uL (ref 150–400)
RBC: 4.41 MIL/uL (ref 3.87–5.11)
RDW: 12.2 % (ref 11.5–15.5)
WBC: 8.5 K/uL (ref 4.0–10.5)
nRBC: 0 % (ref 0.0–0.2)

## 2024-01-02 LAB — BASIC METABOLIC PANEL WITH GFR
Anion gap: 12 (ref 5–15)
BUN: 14 mg/dL (ref 6–20)
CO2: 23 mmol/L (ref 22–32)
Calcium: 9.3 mg/dL (ref 8.9–10.3)
Chloride: 104 mmol/L (ref 98–111)
Creatinine, Ser: 0.77 mg/dL (ref 0.44–1.00)
GFR, Estimated: >60 mL/min (ref >60)
Glucose, Bld: 90 mg/dL (ref 70–99)
Potassium: 3.5 mmol/L (ref 3.5–5.1)
Sodium: 139 mmol/L (ref 135–145)

## 2024-01-02 LAB — TSH: TSH: 2.998 u[IU]/mL (ref 0.350–4.500)

## 2024-01-02 LAB — GROUP A STREP BY PCR: Group A Strep by PCR: NOT DETECTED

## 2024-01-02 MED ORDER — PANTOPRAZOLE SODIUM 40 MG PO TBEC
40.0000 mg | DELAYED_RELEASE_TABLET | Freq: Once | ORAL | Status: AC
  Administered 2024-01-02: 40 mg via ORAL
  Filled 2024-01-02: qty 1

## 2024-01-02 MED ORDER — IOHEXOL 300 MG/ML  SOLN
75.0000 mL | Freq: Once | INTRAMUSCULAR | Status: AC | PRN
  Administered 2024-01-02: 75 mL via INTRAVENOUS

## 2024-01-02 MED ORDER — PANTOPRAZOLE SODIUM 40 MG PO TBEC: 40.0000 mg | DELAYED_RELEASE_TABLET | Freq: Every day | ORAL | 0 refills | Status: AC

## 2024-01-02 NOTE — ED Triage Notes (Addendum)
 Pt with c/o throat feels like something sitting on it. Pain since Friday. Not able to lay flat. Hurts to swallow, denies any fevers.

## 2024-01-02 NOTE — Discharge Instructions (Signed)
 As discussed, your CT this evening shows some thyroid  nodules.  I recommend that you follow-up with your primary care provider regarding this.  Please take the Protonix  once daily for the next 2 weeks.  Return to the emergency department for any new or worsening symptoms

## 2024-01-02 NOTE — ED Provider Notes (Signed)
 [place] EMERGENCY DEPARTMENT AT [facility] Provider Note   CSN: [id number] Arrival date & time: 01/02/24  1701     Patient presents with: Sore Throat   Alexandra Solis is a 28 y.o. female.    Sore Throat Pertinent negatives include no chest pain, no abdominal pain, no headaches and no shortness of breath.       Alexandra Solis is a 28 y.o. female past medical history of anxiety, hypertension, thyroid  disease and seizures who presents to the Emergency Department complaining of feeling that she has something stuck in her throat.  She describes a pressure at the base of her throat present for 4 days.  She denies pain associated with swallowing.  No fever or chills.  Denies  any cough or congestion symptoms, weight loss or weight gain.  States this sensation is worse when lying supine.  Endorses history of thyroid  disorder including thyroid  nodules, but does not currently take any thyroid  medication.   Prior to Admission medications   Medication Sig Start Date End Date Taking? Authorizing Provider  cyclobenzaprine  (FLEXERIL ) 5 MG tablet Take 1-2 tablets (5-10 mg total) by mouth at bedtime. 12/16/22   Brooks, Dana, DO  hydrOXYzine  (VISTARIL ) 25 MG capsule Take 1 capsule (25 mg total) by mouth every 8 (eight) hours as needed. 12/21/23   Glennon Sand, NP  meloxicam  (MOBIC ) 15 MG tablet Take 1 tablet (15 mg total) by mouth daily. 10/04/22   Brooks, Dana, DO  venlafaxine  XR (EFFEXOR -XR) 37.5 MG 24 hr capsule Take 1 capsule (37.5 mg total) by mouth daily with breakfast. 12/21/23   Boswell, Chelsa, NP    Allergies: Seroquel [quetiapine fumarate] and Quetiapine    Review of Systems  Constitutional:  Negative for chills and fever.  HENT:  Positive for trouble swallowing. Negative for congestion, ear pain and sore throat.   Eyes:  Negative for visual disturbance.  Respiratory:  Negative for shortness of breath.   Cardiovascular:  Negative for chest pain.  Gastrointestinal:   Negative for abdominal pain, nausea and vomiting.  Musculoskeletal:  Negative for neck pain and neck stiffness.  Neurological:  Negative for dizziness and headaches.    Updated Vital Signs BP (!) 128/94   Pulse (!) 54   Temp 98.1 F (36.7 C) (Oral)   Resp 19   Ht 6' (1.829 m)   Wt 90.7 kg   LMP 12/12/2023   SpO2 97%   BMI 27.12 kg/m   Physical Exam Vitals and nursing note reviewed.  Constitutional:      General: She is not in acute distress.    Appearance: She is well-developed.  HENT:     Mouth/Throat:     Mouth: Mucous membranes are moist.     Pharynx: Uvula midline. No pharyngeal swelling, posterior oropharyngeal erythema or uvula swelling.  Neck:     Thyroid : No thyromegaly or thyroid  tenderness.     Trachea: Phonation normal.  Cardiovascular:     Rate and Rhythm: Normal rate and regular rhythm.     Pulses: Normal pulses.  Pulmonary:     Effort: Pulmonary effort is normal.  Musculoskeletal:        General: Normal range of motion.     Cervical back: No rigidity.  Lymphadenopathy:     Cervical: No cervical adenopathy.  Skin:    General: Skin is warm.     Capillary Refill: Capillary refill takes less than 2 seconds.  Neurological:     General: No focal deficit present.  Mental Status: She is alert.     Sensory: No sensory deficit.     Motor: No weakness.     (all labs ordered are listed, but only abnormal results are displayed) Labs Reviewed  GROUP A STREP BY PCR  RESP PANEL BY RT-PCR (RSV, FLU A&B, COVID)  [id number]  CBC WITH DIFFERENTIAL/PLATELET  BASIC METABOLIC PANEL WITH GFR  TSH  T4, FREE    EKG: None  Radiology: CT Soft Tissue Neck W Contrast Result Date: 01/02/2024 CLINICAL DATA:  Initial evaluation for hyperparathyroidism. EXAM: CT NECK WITH CONTRAST TECHNIQUE: Multidetector CT imaging of the neck was performed using the standard protocol following the bolus administration of intravenous contrast. RADIATION DOSE REDUCTION: This exam was  performed according to the departmental dose-optimization program which includes automated exposure control, adjustment of the mA and/or kV according to patient size and/or use of iterative reconstruction technique. CONTRAST:  75mL OMNIPAQUE  IOHEXOL  300 MG/ML  SOLN COMPARISON:  Prior thyroid  ultrasound from 09/01/2022 FINDINGS: Pharynx and larynx: Oral cavity within normal limits. Oropharynx and nasopharynx within normal limits. No retropharyngeal collection or swelling. Negative epiglottis. Vallecula clear. Hypopharynx and supraglottic larynx within normal limits. Glottis symmetric and normal. Subglottic airway patent clear. Salivary glands: Salivary glands including the parotid and submandibular glands are within normal limits. Thyroid : Few small thyroid  nodules noted, largest of which is partially calcified and measures 1.3 cm at the lower right thyroid  lobe. These have been previously evaluated by thyroid  ultrasound on 09/01/2022 (ref: J Am Coll Radiol. 2015 Feb;12(2): 143-50).No other visible mass by CT. Lymph nodes: No pathologic or enlarged lymph nodes within the neck. Vascular: Normal intravascular enhancement seen throughout the neck. Limited intracranial: Unremarkable. Visualized orbits: Unremarkable. Mastoids and visualized paranasal sinuses: Visualized paranasal sinuses are clear. Visualized mastoid air cells and middle ear cavities are well pneumatized and free of fluid. Skeleton: No discrete or worrisome osseous lesions. Upper chest: No other acute finding. Other: None. IMPRESSION: 1. Few small thyroid  nodules, largest of which measures 1.3 cm at the lower right thyroid  lobe. These have been previously evaluated by thyroid  ultrasound on 09/01/2022. Please refer to this examination regarding any potential follow-up recommendations regarding this finding. 2. No other acute abnormality within the neck. No other mass or adenopathy. Electronically Signed   By: Morene Hoard M.D.   On: 01/02/2024  22:48     Procedures   Medications Ordered in the ED  iohexol  (OMNIPAQUE ) 300 MG/ML solution 75 mL (75 mLs Intravenous Contrast Given 01/02/24 2209)  pantoprazole  (PROTONIX ) EC tablet 40 mg (40 mg Oral Given 01/02/24 2355)                                    Medical Decision Making Patient here with globus sensation for several days.  Endorses history of thyroid  disorder with previously diagnosed thyroid  nodules.  Does not currently take any thyroid  medication.  States that she had a normal thyroid  test recently.  Denies any URI symptoms, weight loss weight gain or significant changes in mood  Globus sensation possibly related to worsening of her thyroid  condition, doubtful of thyroid  storm, possible acid reflux symptoms, doubt infectious process such as strep pharyngitis  Amount and/or Complexity of Data Reviewed Labs: ordered.    Details: Labs unremarkable including respiratory panel and strep screen Radiology: ordered.    Details: CT soft tissue of the neck shows some small thyroid  nodules that have been previously evaluated by ultrasound  in 2024.  No acute findings or mass Discussion of management or test interpretation with external provider(s): Patient here with globus sensation, cause unclear.  Possibly related to thyroid  nodules.  Patient is aware of having thyroid  nodules and had ultrasound of her thyroid  in 2024.  TSH and T4 levels are reassuring.  Patient was hypertensive on arrival but On my recheck, blood pressure 128/94  Will try prescription for Protonix  as may be related to acid reflux.  Doubt emergent process, feel that she is appropriate for discharge at this time and will follow-up closely outpatient with PCP.  Return precautions were discussed  Risk Prescription drug management.        Final diagnoses:  Globus sensation    ED Discharge Orders     None          Herlinda Milling, PA-C 01/05/24 1414    Suzette Pac, MD 01/12/24 [phone]

## 2024-01-03 LAB — T4, FREE: Free T4: 0.92 ng/dL (ref 0.61–1.12)

## 2024-01-12 ENCOUNTER — Ambulatory Visit: Payer: Self-pay | Admitting: Nurse Practitioner

## 2024-01-17 ENCOUNTER — Encounter: Payer: Self-pay | Admitting: Nurse Practitioner

## 2024-01-18 ENCOUNTER — Other Ambulatory Visit (HOSPITAL_COMMUNITY)
Admission: RE | Admit: 2024-01-18 | Discharge: 2024-01-18 | Disposition: A | Discharge status: Home / Self Care | Source: Ambulatory Visit | Attending: Obstetrics & Gynecology | Admitting: Obstetrics & Gynecology

## 2024-01-18 ENCOUNTER — Encounter: Payer: Self-pay | Admitting: Obstetrics & Gynecology

## 2024-01-18 ENCOUNTER — Ambulatory Visit (INDEPENDENT_AMBULATORY_CARE_PROVIDER_SITE_OTHER): Admitting: Obstetrics & Gynecology

## 2024-01-18 VITALS — BP 118/80 | HR 60 | Ht 72.0 in | Wt 198.6 lb

## 2024-01-18 DIAGNOSIS — N920 Excessive and frequent menstruation with regular cycle: Secondary | ICD-10-CM | POA: Diagnosis not present

## 2024-01-18 DIAGNOSIS — Z3009 Encounter for other general counseling and advice on contraception: Secondary | ICD-10-CM | POA: Diagnosis not present

## 2024-01-18 DIAGNOSIS — Z124 Encounter for screening for malignant neoplasm of cervix: Secondary | ICD-10-CM | POA: Insufficient documentation

## 2024-01-18 DIAGNOSIS — Z01419 Encounter for gynecological examination (general) (routine) without abnormal findings: Secondary | ICD-10-CM

## 2024-01-18 MED ORDER — TRANEXAMIC ACID 650 MG PO TABS: ORAL_TABLET | ORAL | 6 refills | Status: AC

## 2024-01-18 NOTE — Progress Notes (Signed)
 WELL-WOMAN EXAMINATION Patient name: Alexandra Solis MRN [id number]  Date of birth: 09-15-1995 Chief Complaint:   Annual Exam  History of Present Illness:   Alexandra Solis is a 28 y.o. G0P0000 female being seen today for a routine well-woman exam.   Menses are monthly and regular- about 5-7 days- super plus tampons every 2 hours for the first 2 to 3 days.  Then, bleeding subsides.  Some dysmenorrhea, taking some ibuprofen  with some improvement.  Denies intermenstrual bleeding.  Currently denies vaginal discharge, itching or irritation.  She does note intermittent episodes of redness and irritation of her labia.  Issue typically resolves on its own.  She is not sure what seems to make it worse or better  Not currently sexually active and considering a pregnancy in the near future  Patient's last menstrual period was 12/12/2023.  The current method of family planning is abstinence.    Last pap 2020.  Last mammogram: NA. Last colonoscopy: NA     01/18/2024    2:34 PM 12/21/2023    9:15 AM 01/12/2023    9:47 AM 10/06/2022    9:01 AM 08/23/2022    2:49 PM  Depression screen PHQ 2/9  Decreased Interest 0 2 0 3 3  Down, Depressed, Hopeless 0 2 0 3 3  PHQ - 2 Score 0 4 0 6 6  Altered sleeping 3 2  3 3   Tired, decreased energy 3 3  3 3   Change in appetite 0 0  3 3  Feeling bad or failure about yourself  0 2  3 2   Trouble concentrating 3 3  3 3   Moving slowly or fidgety/restless 0 0  3 3  Suicidal thoughts 0 0  0 0  PHQ-9 Score 9 14  24 23   Difficult doing work/chores  Somewhat difficult   Extremely dIfficult      Review of Systems:   Pertinent items are noted in HPI Denies any headaches, blurred vision, fatigue, shortness of breath, chest pain, abdominal pain, bowel movements, urination, or intercourse unless otherwise stated above.  Pertinent History Reviewed:  Reviewed past medical,surgical, social and family history.  Reviewed problem list, medications and  allergies. Physical Assessment:   Vitals:   01/18/24 1414  BP: 118/80  Pulse: 60  Weight: 198 lb 9.6 oz (90.1 kg)  Height: 6' (1.829 m)  Body mass index is 26.94 kg/m.        Physical Examination:   General appearance - well appearing, and in no distress  Mental status - alert, oriented to person, place, and time  Psych:  She has a normal mood and affect  Skin - warm and dry, normal color, no suspicious lesions noted  Chest - effort normal, all lung fields clear to auscultation bilaterally  Heart - normal rate and regular rhythm  Neck:  midline trachea, no thyromegaly or nodules  Breasts - breasts appear normal, no suspicious masses, no skin or nipple changes or  axillary nodes  Abdomen - soft, nontender, nondistended, no masses or organomegaly  Pelvic - VULVA: normal appearing vulva with no masses, tenderness or lesions  VAGINA: normal appearing vagina with normal color and discharge, no lesions  CERVIX: normal appearing cervix without discharge or lesions, no CMT  Thin prep pap is done with HR HPV cotesting  UTERUS: uterus is felt to be normal size, shape, consistency and nontender   ADNEXA: No adnexal masses or tenderness noted.  Extremities:  No swelling or varicosities noted  Chaperone: Aleck  Preston     Assessment & Plan:  1) Well-Woman Exam -Pap collected, reviewed ASCCP guidelines  2) family-planning, heavy menstrual bleeding - Discussed options due to desire for future fertility, COC is not ideal - Plan for Lysteda  patient to take as needed for heavy bleeding - Questions and concerns were addressed - Encourage healthy you and to start PNV daily  No orders of the defined types were placed in this encounter.   Meds:  Meds ordered this encounter  Medications   tranexamic acid  (LYSTEDA ) 650 MG TABS tablet    Sig: Take 2 tablets 3 times daily as needed during menses    Dispense:  30 tablet    Refill:  6    Follow-up: Return in about 1 year (around  01/17/2025) for Annual.   Kathy Wahid, DO Attending Obstetrician & Gynecologist, [facility] for [facility], [facility]

## 2024-01-23 ENCOUNTER — Ambulatory Visit: Payer: Self-pay | Admitting: Obstetrics & Gynecology

## 2024-01-23 ENCOUNTER — Ambulatory Visit: Admitting: Nurse Practitioner

## 2024-01-23 LAB — CYTOLOGY - PAP
Adequacy: ABSENT
Comment: NEGATIVE
Diagnosis: NEGATIVE
High risk HPV: NEGATIVE

## 2024-02-08 ENCOUNTER — Ambulatory Visit: Payer: Self-pay | Admitting: Urgent Care

## 2024-02-08 ENCOUNTER — Ambulatory Visit
Admission: EM | Admit: 2024-02-08 | Discharge: 2024-02-08 | Disposition: A | Discharge status: Home / Self Care | Attending: Family Medicine | Admitting: Family Medicine

## 2024-02-08 ENCOUNTER — Ambulatory Visit (INDEPENDENT_AMBULATORY_CARE_PROVIDER_SITE_OTHER)

## 2024-02-08 DIAGNOSIS — J209 Acute bronchitis, unspecified: Secondary | ICD-10-CM

## 2024-02-08 DIAGNOSIS — J4521 Mild intermittent asthma with (acute) exacerbation: Secondary | ICD-10-CM

## 2024-02-08 MED ORDER — ALBUTEROL SULFATE HFA 108 (90 BASE) MCG/ACT IN AERS: 1.0000 | INHALATION_SPRAY | Freq: Four times a day (QID) | RESPIRATORY_TRACT | 0 refills | Status: AC | PRN

## 2024-02-08 MED ORDER — PREDNISONE 20 MG PO TABS: ORAL_TABLET | ORAL | 0 refills | Status: DC

## 2024-02-08 MED ORDER — PROMETHAZINE-DM 6.25-15 MG/5ML PO SYRP: 5.0000 mL | ORAL_SOLUTION | Freq: Three times a day (TID) | ORAL | 0 refills | Status: DC | PRN

## 2024-02-08 MED ORDER — AZITHROMYCIN 250 MG PO TABS: ORAL_TABLET | ORAL | 0 refills | Status: DC

## 2024-02-08 NOTE — ED Triage Notes (Signed)
 Pt c/o cough, chest congestion, wheezing, pain to right rib area x 1 month-no relief OTC meds-NAD-steady gait

## 2024-02-08 NOTE — Discharge Instructions (Addendum)
 Will update your chest x-ray results later today.  For now, start prednisone  and azithromycin to address your bronchitis.  Use albuterol  for your reactive airways.  Cough syrup as needed.

## 2024-02-08 NOTE — ED Provider Notes (Addendum)
 [facility]  Note:  This document was prepared using Conservation officer, historic buildings and may include unintentional dictation errors.  MRN: [id number] DOB: 01-25-1996  Subjective:   Alexandra Solis is a 28 y.o. female presenting for 1 month history of persistent worsening coughing, shortness of breath, chest congestion, wheezing, pain to the right lower lateral chest wall and left upper anterior chest wall.  No formal diagnosis of asthma but she has done well with albuterol  and steroids in the past.  She vapes on occasion.  No upper respiratory symptoms, sinus pain, ear pain, throat pain.  No current facility-administered medications for this encounter.  Current Outpatient Medications:    hydrOXYzine  (VISTARIL ) 25 MG capsule, Take 1 capsule (25 mg total) by mouth every 8 (eight) hours as needed., Disp: 30 capsule, Rfl: 0   pantoprazole  (PROTONIX ) 40 MG tablet, Take 1 tablet (40 mg total) by mouth daily., Disp: 14 tablet, Rfl: 0   tranexamic acid  (LYSTEDA ) 650 MG TABS tablet, Take 2 tablets 3 times daily as needed during menses, Disp: 30 tablet, Rfl: 6   venlafaxine  XR (EFFEXOR -XR) 37.5 MG 24 hr capsule, Take 1 capsule (37.5 mg total) by mouth daily with breakfast., Disp: 90 capsule, Rfl: 1   Allergies  Allergen Reactions   Seroquel [Quetiapine Fumarate] Other (See Comments)    Behavorial changes   Quetiapine     Other reaction(s): Unknown Other reaction(s): Other (See Comments) Behavioral changes    Past Medical History:  Diagnosis Date   Bipolar 1 disorder (HCC)    Bipolar 1 disorder (HCC)    Hypertension    PCOS (polycystic ovarian syndrome)    Seizures (HCC)    Thyroid  disease    hypo     Past Surgical History:  Procedure Laterality Date   TONSILLECTOMY     WISDOM TOOTH EXTRACTION Bilateral     Family History  Problem Relation Age of Onset   Uterine cancer Paternal Grandmother    Colon cancer Maternal Grandfather    Cancer Father     Heart disease Mother     Social History   Tobacco Use   Smoking status: Former    Types: Cigarettes   Smokeless tobacco: Never  Vaping Use   Vaping status: Every Day   Substances: Nicotine, Flavoring  Substance Use Topics   Alcohol use: Not Currently   Drug use: Yes    Types: Marijuana    ROS   Objective:   Vitals: BP (!) 125/90 (BP Location: Right Arm)   Pulse 72   Temp 98.2 F (36.8 C) (Oral)   Resp 16   LMP 01/28/2024 (Approximate)   SpO2 98%   Physical Exam Constitutional:      General: She is not in acute distress.    Appearance: Normal appearance. She is well-developed and normal weight. She is not ill-appearing, toxic-appearing or diaphoretic.  HENT:     Head: Normocephalic and atraumatic.     Right Ear: Tympanic membrane, ear canal and external ear normal. No drainage or tenderness. No middle ear effusion. There is no impacted cerumen. Tympanic membrane is not erythematous or bulging.     Left Ear: Tympanic membrane, ear canal and external ear normal. No drainage or tenderness.  No middle ear effusion. There is no impacted cerumen. Tympanic membrane is not erythematous or bulging.     Nose: Nose normal. No congestion or rhinorrhea.     Mouth/Throat:     Mouth: Mucous membranes are moist. No oral lesions.  Pharynx: No pharyngeal swelling, oropharyngeal exudate, posterior oropharyngeal erythema or uvula swelling.     Tonsils: No tonsillar exudate or tonsillar abscesses.  Eyes:     General: No scleral icterus.       Right eye: No discharge.        Left eye: No discharge.     Extraocular Movements: Extraocular movements intact.     Right eye: Normal extraocular motion.     Left eye: Normal extraocular motion.     Conjunctiva/sclera: Conjunctivae normal.  Cardiovascular:     Rate and Rhythm: Normal rate and regular rhythm.     Heart sounds: Normal heart sounds. No murmur heard.    No friction rub. No gallop.  Pulmonary:     Effort: Pulmonary effort is  normal. No respiratory distress.     Breath sounds: No stridor. Rhonchi (trace over mid lung fields bilaterally) present. No wheezing or rales.  Chest:     Chest wall: No tenderness.  Musculoskeletal:     Cervical back: Normal range of motion and neck supple.  Lymphadenopathy:     Cervical: No cervical adenopathy.  Skin:    General: Skin is warm and dry.  Neurological:     General: No focal deficit present.     Mental Status: She is alert and oriented to person, place, and time.  Psychiatric:        Mood and Affect: Mood normal.        Behavior: Behavior normal.     Assessment and Plan :   PDMP not reviewed this encounter.  1. Acute bronchitis, unspecified organism   2. Mild intermittent reactive airway disease with acute exacerbation    Patient has previously done well with steroids, albuterol .  Recommended oral prednisone  course, provided patient with a refill of albuterol .  Recommended azithromycin.  Overread pending. Counseled patient on potential for adverse effects with medications prescribed/recommended today, ER and return-to-clinic precautions discussed, patient verbalized understanding.    Christopher Savannah, [place] 02/08/24 [phone]

## 2024-03-06 ENCOUNTER — Encounter: Payer: Self-pay | Admitting: Orthopedic Surgery

## 2024-03-06 ENCOUNTER — Other Ambulatory Visit (INDEPENDENT_AMBULATORY_CARE_PROVIDER_SITE_OTHER): Payer: Self-pay

## 2024-03-06 ENCOUNTER — Ambulatory Visit: Admitting: Orthopedic Surgery

## 2024-03-06 VITALS — Ht 72.0 in | Wt 198.6 lb

## 2024-03-06 DIAGNOSIS — M5442 Lumbago with sciatica, left side: Secondary | ICD-10-CM

## 2024-03-06 DIAGNOSIS — G8929 Other chronic pain: Secondary | ICD-10-CM | POA: Diagnosis not present

## 2024-03-06 NOTE — Progress Notes (Signed)
 Orthopedic Spine Surgery Office Note  Assessment: Patient is a 28 y.o. female with low back pain with intermittent radiation into the left lateral thigh   Plan: -Explained that initially conservative treatment is tried as a significant number of patients may experience relief with these treatment modalities. Discussed that the conservative treatments include:  -activity modification  -physical therapy  -over the counter pain medications  -medrol  dosepak  -lumbar steroid injections -Patient has tried Tylenol  -Recommended PT.  Referral provided to her today -If she is not doing any better at her next visit, we will order an MRI of the lumbar spine to evaluate further -Patient would need to be nicotine free prior to any elective spine surgery -Patient should return to office in 8 weeks, x-rays at next visit: none   Patient expressed understanding of the plan and all questions were answered to the patient's satisfaction.   ___________________________________________________________________________   History:  Patient is a 28 y.o. female who presents today for lumbar spine.  Patient has had several years of low back pain.  Pain is felt in the lower lumbar region.  She notes the pain radiating into the left lateral thigh at times.  It does not radiate past the knee.  She has no pain rating into the right lower extremity.  She has not tried any treatments recently for this pain.  Sometimes, she feels that the back will lock up on her and she has pain radiating a little higher up in her back.   Weakness: denies Symptoms of imbalance: denies Paresthesias and numbness: denies Bowel or bladder incontinence: denies Saddle anesthesia: denies  Treatments tried: tylenol   Review of systems: Denies fevers and chills, night sweats, unexplained weight loss, history of cancer, pain that wakes her at night  Past medical history: HTN GERD Depression/anxiety Chronic pain PCOS  Allergies:  seroquel, quetiapine  Past surgical history:  Tonsillectomy  Social history: Reports use of nicotine product (smoking, vaping, patches, smokeless) Alcohol use: yes, approximately 1-2 drinks per week Denies recreational drug use   Physical Exam:  BMI of 26.9  General: no acute distress, appears stated age Neurologic: alert, answering questions appropriately, following commands Respiratory: unlabored breathing on room air, symmetric chest rise Psychiatric: appropriate affect, normal cadence to speech   MSK (spine):  -Strength exam      Left  Right EHL    5/5  5/5 TA    5/5  5/5 GSC    5/5  5/5 Knee extension  5/5  5/5 Hip flexion   5/5  5/5  -Sensory exam    Sensation intact to light touch in L3-S1 nerve distributions of bilateral lower extremities  -Achilles DTR: 1/4 on the left, 1/4 on the right -Patellar tendon DTR: 1/4 on the left, 1/4 on the right  -Straight leg raise: negative bilaterally -Clonus: no beats bilaterally  -Left hip exam: no pain through range of motion -Right hip exam: no pain through range of motion  Imaging: XRs of the lumbar spine from 03/06/2024 were independently reviewed and interpreted, showing disc height loss at L5/S1.  There appears to be a lucency in the disc space seen best on the extension view that may represent vacuum disc phenomenon.  No other degenerative changes seen in the lumbar spine.  Schmorl's nodes are seen at L3-4, L2-3, L1-2.  No fracture or dislocation seen.  No evidence of instability on flexion/extension views.   Patient name: Alexandra Solis Patient MRN: [id number] Date of visit: 03/06/24

## 2024-03-11 ENCOUNTER — Encounter: Payer: Self-pay | Admitting: Radiology

## 2024-03-15 ENCOUNTER — Ambulatory Visit: Attending: Cardiology | Admitting: Cardiology

## 2024-03-15 ENCOUNTER — Encounter: Payer: Self-pay | Admitting: Cardiology

## 2024-03-15 ENCOUNTER — Telehealth: Payer: Self-pay | Admitting: Cardiology

## 2024-03-15 VITALS — BP 122/72 | HR 55 | Ht 70.0 in | Wt 203.4 lb

## 2024-03-15 DIAGNOSIS — R072 Precordial pain: Secondary | ICD-10-CM | POA: Diagnosis present

## 2024-03-15 DIAGNOSIS — Z7289 Other problems related to lifestyle: Secondary | ICD-10-CM | POA: Diagnosis present

## 2024-03-15 MED ORDER — METOPROLOL TARTRATE 25 MG PO TABS: 25.0000 mg | ORAL_TABLET | Freq: Once | ORAL | 3 refills | Status: DC

## 2024-03-15 NOTE — Progress Notes (Signed)
 Cardiology Office Note:    Date:  03/15/2024   ID:  Alexandra Solis, DOB [birth year], MRN [id number]  PCP:  Alexandra Sand, NP (Inactive)   [facility] Providers Cardiologist:  Redell Cave, MD     Referring MD: No ref. provider found   Chief Complaint  Patient presents with   Follow-up    follow up /  pt has been doing well with no complaints of chest pain, chest pressure . The patient reports SOB, medication reviewed verbally with patient.    History of Present Illness:    Alexandra Solis is a 28 y.o. female former bipolar disorder, smoker x 5 years, currently vapes who presents due to shortness of breath and chest pressure.  Seen about a year ago with symptoms of chest pain, echo and coronary CT was recommended but insurance did not approve.  She states his symptoms have persisted since then.  Also has a history of reflux, takes Protonix  for reflux.  Her current symptoms of chest pain are different from her typical reflux symptoms.  Also endorses shortness of breath with exertion.  She is adopted and unsure of immediate family history.   Past Medical History:  Diagnosis Date   Bipolar 1 disorder (HCC)    Bipolar 1 disorder (HCC)    Hypertension    PCOS (polycystic ovarian syndrome)    Seizures (HCC)    Thyroid  disease    hypo    Past Surgical History:  Procedure Laterality Date   TONSILLECTOMY     WISDOM TOOTH EXTRACTION Bilateral     Current Medications: Current Meds  Medication Sig   albuterol  (VENTOLIN  HFA) 108 (90 Base) MCG/ACT inhaler Inhale 1-2 puffs into the lungs every 6 (six) hours as needed for wheezing or shortness of breath.   hydrOXYzine  (VISTARIL ) 25 MG capsule Take 1 capsule (25 mg total) by mouth every 8 (eight) hours as needed.   metoprolol tartrate (LOPRESSOR) 25 MG tablet Take 1 tablet (25 mg total) by mouth once for 1 dose. 2 hours prior to cardiac CT   pantoprazole  (PROTONIX ) 40 MG tablet Take 1 tablet (40 mg total) by mouth  daily.   tranexamic acid  (LYSTEDA ) 650 MG TABS tablet Take 2 tablets 3 times daily as needed during menses   venlafaxine  XR (EFFEXOR -XR) 37.5 MG 24 hr capsule Take 1 capsule (37.5 mg total) by mouth daily with breakfast.   [DISCONTINUED] promethazine -dextromethorphan (PROMETHAZINE -DM) 6.25-15 MG/5ML syrup Take 5 mLs by mouth 3 (three) times daily as needed for cough.     Allergies:   Seroquel [quetiapine fumarate] and Quetiapine   Social History   Socioeconomic History   Marital status: Single    Spouse name: Not on file   Number of children: Not on file   Years of education: Not on file   Highest education level: Not on file  Occupational History   Occupation: conservation officer, nature at [employer]  Tobacco Use   Smoking status: Former    Types: Cigarettes   Smokeless tobacco: Never  Vaping Use   Vaping status: Every Day   Substances: Nicotine, Flavoring  Substance and Sexual Activity   Alcohol use: Not Currently   Drug use: Yes    Types: Marijuana   Sexual activity: Not Currently  Other Topics Concern   Not on file  Social History Narrative   Not on file   Social Drivers of Health   Financial Resource Strain: Low Risk  (01/18/2024)   Overall Financial Resource Strain (CARDIA)  Difficulty of Paying Living Expenses: Not hard at all  Food Insecurity: Food Insecurity Present (01/18/2024)   Hunger Vital Sign    Worried About Running Out of Food in the Last Year: Sometimes true    Ran Out of Food in the Last Year: Sometimes true  Transportation Needs: No Transportation Needs (01/18/2024)   PRAPARE - Administrator, Civil Service (Medical): No    Lack of Transportation (Non-Medical): No  Physical Activity: Sufficiently Active (01/18/2024)   Exercise Vital Sign    Days of Exercise per Week: 5 days    Minutes of Exercise per Session: 100 min  Stress: Stress Concern Present (01/18/2024)   [name] of Occupational Health - Occupational Stress Questionnaire    Feeling  of Stress: To some extent  Social Connections: Moderately Isolated (01/18/2024)   Social Connection and Isolation Panel    Frequency of Communication with Friends and Family: More than three times a week    Frequency of Social Gatherings with Friends and Family: Twice a week    Attends Religious Services: More than 4 times per year    Active Member of [employer] or Organizations: No    Attends Engineer, Structural: Never    Marital Status: Never married     Family History: The patient's family history includes Cancer in her father; Colon cancer in her maternal grandfather; Heart disease in her mother; Uterine cancer in her paternal grandmother.  ROS:   Please see the history of present illness.     All other systems reviewed and are negative.  EKGs/Labs/Other Studies Reviewed:    The following studies were reviewed today:  EKG Interpretation Date/Time:  Friday March 15 2024 15:45:27 EST Ventricular Rate:  55 PR Interval:  144 QRS Duration:  90 QT Interval:  424 QTC Calculation: 405 R Axis:   42  Text Interpretation: Sinus bradycardia Confirmed by Darliss Rogue ([id number]) on 03/15/2024 3:52:08 PM    Recent Labs: 12/21/2023: ALT 13 01/02/2024: BUN 14; Creatinine, Ser 0.77; Hemoglobin 13.9; Platelets 288; Potassium 3.5; Sodium 139; TSH 2.998  Recent Lipid Panel    Component Value Date/Time   CHOL 143 12/21/2023 1004   TRIG 49 12/21/2023 1004   HDL 42 12/21/2023 1004   CHOLHDL 3.4 12/21/2023 1004   CHOLHDL 2.9 08/23/2022 1549   VLDL 23 05/14/2020 1340   LDLCALC 90 12/21/2023 1004   LDLCALC 79 08/23/2022 1549     Risk Assessment/Calculations:          Physical Exam:    VS:  BP 122/72 (BP Location: Left Arm, Patient Position: Sitting, Cuff Size: Normal)   Pulse (!) 55   Ht 5' 10 (1.778 m)   Wt 203 lb 6.4 oz (92.3 kg)   SpO2 98%   BMI 29.18 kg/m     Wt Readings from Last 3 Encounters:  03/15/24 203 lb 6.4 oz (92.3 kg)  03/06/24 198 lb 9.6 oz (90.1 kg)   01/18/24 198 lb 9.6 oz (90.1 kg)     GEN:  Well nourished, well developed in no acute distress HEENT: Normal NECK: No JVD; No carotid bruits CARDIAC: RRR, no murmurs, rubs, gallops RESPIRATORY:  Clear to auscultation without rales, wheezing or rhonchi  ABDOMEN: Soft, non-tender, non-distended MUSCULOSKELETAL:  No edema; No deformity  SKIN: Warm and dry NEUROLOGIC:  Alert and oriented x 3 PSYCHIATRIC:  Normal affect   ASSESSMENT:    1. Precordial pain   2. Engages in vaping    PLAN:  In order of problems listed above:  Chest pain, shortness of breath with exertion.  Obtain echo, obtain coronary CT. Currently vapes, cessation advised.  Follow-up after cardiac testing.  Reassure patient if testing is normal.      Medication Adjustments/Labs and Tests Ordered: Current medicines are reviewed at length with the patient today.  Concerns regarding medicines are outlined above.  Orders Placed This Encounter  Procedures   CT CORONARY MORPH W/CTA COR W/SCORE W/CA W/CM &/OR WO/CM   Basic metabolic panel with GFR   EKG 87-Ozji   ECHOCARDIOGRAM COMPLETE   Meds ordered this encounter  Medications   metoprolol tartrate (LOPRESSOR) 25 MG tablet    Sig: Take 1 tablet (25 mg total) by mouth once for 1 dose. 2 hours prior to cardiac CT    Dispense:  180 tablet    Refill:  3    Patient Instructions  Medication Instructions:  - take 25 mg metoprolol two hours prior to cardiac CT  *If you need a refill on your cardiac medications before your next appointment, please call your pharmacy*  Lab Work: Your provider would like for you to have following labs drawn today BMP.   If you have labs (blood work) drawn today and your tests are completely normal, you will receive your results only by: MyChart Message (if you have MyChart) OR A paper copy in the mail If you have any lab test that is abnormal or we need to change your treatment, we will call you to review the  results.  Testing/Procedures: Your physician has requested that you have an echocardiogram. Echocardiography is a painless test that uses sound waves to create images of your heart. It provides your doctor with information about the size and shape of your heart and how well your heart's chambers and valves are working.   You may receive an ultrasound enhancing agent through an IV if needed to better visualize your heart during the echo. This procedure takes approximately one hour.  There are no restrictions for this procedure.  This will take place at [address] [facility] [address]) [address], [place] [address]  Please note: We ask at that you not bring children with you during ultrasound (echo/ vascular) testing. Due to room size and safety concerns, children are not allowed in the ultrasound rooms during exams. Our front office staff cannot provide observation of children in our lobby area while testing is being conducted. An adult accompanying a patient to their appointment will only be allowed in the ultrasound room at the discretion of the ultrasound technician under special circumstances. We apologize for any inconvenience.   Your cardiac CT will be scheduled at:  [facility] [address] [place], [place] [address] [phone]  Please arrive 15 mins early for check-in and test prep.  There is spacious parking and easy access to the radiology department from the [facility] Heart and Vascular entrance. Please enter here and check-in with the desk attendant.    Please follow these instructions carefully (unless otherwise directed):  An IV will be required for this test and Nitroglycerin will be given.  Hold all erectile dysfunction medications at least 3 days (72 hrs) prior to test. (Ie viagra, cialis, sildenafil, tadalafil, etc)     On the Night Before the Test: Be sure to Drink plenty of water. Do not consume any caffeinated/decaffeinated beverages  or chocolate 12 hours prior to your test. Do not take any antihistamines 12 hours prior to your test.  On the Day of the Test: Drink plenty of water until 1 hour prior to the test. Do not eat any food 1 hour prior to test. You may take your regular medications prior to the test.  Take metoprolol (Lopressor) two hours prior to test. If you take Furosemide/Hydrochlorothiazide/Spironolactone/Chlorthalidone, please HOLD on the morning of the test. Patients who wear a continuous glucose monitor MUST remove the device prior to scanning. FEMALES- please wear underwire-free bra if available, avoid dresses & tight clothing       After the Test: Drink plenty of water. After receiving IV contrast, you may experience a mild flushed feeling. This is normal. On occasion, you may experience a mild rash up to 24 hours after the test. This is not dangerous. If this occurs, you can take Benadryl  25 mg, Zyrtec, Claritin , or Allegra and increase your fluid intake. (Patients taking Tikosyn should avoid Benadryl , and may take Zyrtec, Claritin , or Allegra) If you experience trouble breathing, this can be serious. If it is severe call 911 IMMEDIATELY. If it is mild, please call our office.  We will call to schedule your test 2-4 weeks out understanding that some insurance companies will need an authorization prior to the service being performed.   For more information and frequently asked questions, please visit our website : [email or web address]  For non-scheduling related questions, please contact the cardiac imaging nurse navigator should you have any questions/concerns: Cardiac Imaging Nurse Navigators Direct Office Dial: [phone]   For scheduling needs, including cancellations and rescheduling, please call Brittany, [phone].    Follow-Up: At [facility], you and your health needs are our priority.  As part of our continuing mission to provide you with exceptional heart  care, our providers are all part of one team.  This team includes your primary Cardiologist (physician) and Advanced Practice Providers or APPs (Physician Assistants and Nurse Practitioners) who all work together to provide you with the care you need, when you need it.  Your next appointment:   3 month(s)  Provider:   You may see Redell Cave, MD or one of the following Advanced Practice Providers on your designated Care Team:   Lonni Meager, NP Lesley Maffucci, PA-C Bernardino Bring, PA-C Cadence [place], PA-C Tylene Lunch, NP Barnie Hila, NP    We recommend signing up for the patient portal called MyChart.  Sign up information is provided on this After Visit Summary.  MyChart is used to connect with patients for Virtual Visits (Telemedicine).  Patients are able to view lab/test results, encounter notes, upcoming appointments, etc.  Non-urgent messages can be sent to your provider as well.   To learn more about what you can do with MyChart, go to [email or web address].          Signed, Redell Cave, MD  03/15/2024 4:09 PM    [facility]

## 2024-03-15 NOTE — Patient Instructions (Signed)
 Medication Instructions:  - take 25 mg metoprolol two hours prior to cardiac CT  *If you need a refill on your cardiac medications before your next appointment, please call your pharmacy*  Lab Work: Your provider would like for you to have following labs drawn today BMP.   If you have labs (blood work) drawn today and your tests are completely normal, you will receive your results only by: MyChart Message (if you have MyChart) OR A paper copy in the mail If you have any lab test that is abnormal or we need to change your treatment, we will call you to review the results.  Testing/Procedures: Your physician has requested that you have an echocardiogram. Echocardiography is a painless test that uses sound waves to create images of your heart. It provides your doctor with information about the size and shape of your heart and how well your heart's chambers and valves are working.   You may receive an ultrasound enhancing agent through an IV if needed to better visualize your heart during the echo. This procedure takes approximately one hour.  There are no restrictions for this procedure.  This will take place at [address] [facility] [address]) [address], [place] [address]  Please note: We ask at that you not bring children with you during ultrasound (echo/ vascular) testing. Due to room size and safety concerns, children are not allowed in the ultrasound rooms during exams. Our front office staff cannot provide observation of children in our lobby area while testing is being conducted. An adult accompanying a patient to their appointment will only be allowed in the ultrasound room at the discretion of the ultrasound technician under special circumstances. We apologize for any inconvenience.   Your cardiac CT will be scheduled at:  [facility] [address] [place], [place] [address] [phone]  Please arrive 15 mins early for check-in and test  prep.  There is spacious parking and easy access to the radiology department from the [facility] Heart and Vascular entrance. Please enter here and check-in with the desk attendant.    Please follow these instructions carefully (unless otherwise directed):  An IV will be required for this test and Nitroglycerin will be given.  Hold all erectile dysfunction medications at least 3 days (72 hrs) prior to test. (Ie viagra, cialis, sildenafil, tadalafil, etc)     On the Night Before the Test: Be sure to Drink plenty of water. Do not consume any caffeinated/decaffeinated beverages or chocolate 12 hours prior to your test. Do not take any antihistamines 12 hours prior to your test.  On the Day of the Test: Drink plenty of water until 1 hour prior to the test. Do not eat any food 1 hour prior to test. You may take your regular medications prior to the test.  Take metoprolol (Lopressor) two hours prior to test. If you take Furosemide/Hydrochlorothiazide/Spironolactone/Chlorthalidone, please HOLD on the morning of the test. Patients who wear a continuous glucose monitor MUST remove the device prior to scanning. FEMALES- please wear underwire-free bra if available, avoid dresses & tight clothing       After the Test: Drink plenty of water. After receiving IV contrast, you may experience a mild flushed feeling. This is normal. On occasion, you may experience a mild rash up to 24 hours after the test. This is not dangerous. If this occurs, you can take Benadryl  25 mg, Zyrtec, Claritin , or Allegra and increase your fluid intake. (Patients taking Tikosyn should avoid Benadryl ,  and may take Zyrtec, Claritin , or Allegra) If you experience trouble breathing, this can be serious. If it is severe call 911 IMMEDIATELY. If it is mild, please call our office.  We will call to schedule your test 2-4 weeks out understanding that some insurance companies will need an authorization prior to the service being  performed.   For more information and frequently asked questions, please visit our website : [email or web address]  For non-scheduling related questions, please contact the cardiac imaging nurse navigator should you have any questions/concerns: Cardiac Imaging Nurse Navigators Direct Office Dial: [phone]   For scheduling needs, including cancellations and rescheduling, please call Brittany, [phone].    Follow-Up: At [facility], you and your health needs are our priority.  As part of our continuing mission to provide you with exceptional heart care, our providers are all part of one team.  This team includes your primary Cardiologist (physician) and Advanced Practice Providers or APPs (Physician Assistants and Nurse Practitioners) who all work together to provide you with the care you need, when you need it.  Your next appointment:   3 month(s)  Provider:   You may see Redell Cave, MD or one of the following Advanced Practice Providers on your designated Care Team:   Lonni Meager, NP Lesley Maffucci, PA-C Bernardino Bring, PA-C Cadence [place], PA-C Tylene Lunch, NP Barnie Hila, NP    We recommend signing up for the patient portal called MyChart.  Sign up information is provided on this After Visit Summary.  MyChart is used to connect with patients for Virtual Visits (Telemedicine).  Patients are able to view lab/test results, encounter notes, upcoming appointments, etc.  Non-urgent messages can be sent to your provider as well.   To learn more about what you can do with MyChart, go to [email or web address].

## 2024-03-15 NOTE — Telephone Encounter (Signed)
 Pt c/o medication issue:  1. Name of Medication:   metoprolol tartrate (LOPRESSOR) 25 MG tablet    2. How are you currently taking this medication (dosage and times per day)?   Take 1 tablet (25 mg total) by mouth once for 1 dose. 2 hours prior to cardiac CT   Pt has not started medication yet   3. Are you having a reaction (difficulty breathing--STAT)? No  4. What is your medication issue? Landon with [facility] calling for clarification on metoprolol Rx. Directions are as above, but Rx was written for a 90 day supply, twice daily. Pharmacy needs to know if it should be filled for 90 day supply, or if dosage is just for CT. Please advise.

## 2024-03-16 LAB — BASIC METABOLIC PANEL WITH GFR
BUN/Creatinine Ratio: 15 (ref 9–23)
BUN: 14 mg/dL (ref 6–20)
CO2: 23 mmol/L (ref 20–29)
Calcium: 9 mg/dL (ref 8.7–10.2)
Chloride: 104 mmol/L (ref 96–106)
Creatinine, Ser: 0.95 mg/dL (ref 0.57–1.00)
Glucose: 82 mg/dL (ref 70–99)
Potassium: 4.4 mmol/L (ref 3.5–5.2)
Sodium: 140 mmol/L (ref 134–144)
eGFR: 84 mL/min/1.73 (ref >59)

## 2024-03-17 ENCOUNTER — Other Ambulatory Visit: Payer: Self-pay | Admitting: Medical Genetics

## 2024-03-18 ENCOUNTER — Other Ambulatory Visit: Payer: Self-pay | Admitting: Medical Genetics

## 2024-03-18 ENCOUNTER — Other Ambulatory Visit: Payer: Self-pay

## 2024-03-18 DIAGNOSIS — F419 Anxiety disorder, unspecified: Secondary | ICD-10-CM

## 2024-03-18 MED ORDER — METOPROLOL TARTRATE 25 MG PO TABS: 25.0000 mg | ORAL_TABLET | Freq: Once | ORAL | 0 refills | Status: DC

## 2024-03-18 NOTE — Telephone Encounter (Signed)
 Called and spoke to [place] with [employer]. Stated that I sent in the correct dose for one pill, Landon at pharmacy will void the other prescription.

## 2024-03-22 ENCOUNTER — Ambulatory Visit
Admission: RE | Admit: 2024-03-22 | Discharge: 2024-03-22 | Disposition: A | Discharge status: Home / Self Care | Attending: Emergency Medicine | Admitting: Emergency Medicine

## 2024-03-22 ENCOUNTER — Other Ambulatory Visit

## 2024-03-22 VITALS — BP 96/71 | HR 98 | Temp 98.7°F | Resp 18

## 2024-03-22 DIAGNOSIS — L282 Other prurigo: Secondary | ICD-10-CM | POA: Diagnosis not present

## 2024-03-22 MED ORDER — CETIRIZINE HCL 10 MG PO TABS: 10.0000 mg | ORAL_TABLET | Freq: Every day | ORAL | 0 refills | Status: DC

## 2024-03-22 MED ORDER — PREDNISONE 10 MG (21) PO TBPK: ORAL_TABLET | Freq: Every day | ORAL | 0 refills | Status: DC

## 2024-03-22 NOTE — ED Provider Notes (Signed)
 [facility]    CSN: [id number] Arrival date & time: 03/22/24  1642      History   Chief Complaint Chief Complaint  Patient presents with   Rash    I am broke out really bad all over my body with small circle shaped bumps that are very itchy. I've counted about 20 bumps so far across my arms, chest, chin, and back. I've tried using otc creams but nothing is giving me relief - Entered by patient    HPI Alexandra Solis is a 28 y.o. female.  Patient presents with 5-day history of pruritic rash on her trunk and extremities.  The rash started as a single lesion on her right forearm.  This lesion has not gotten any larger but the rash has spread to other areas.  The rash is extremely pruritic.  No fever, mouth lesions, sore throat, difficulty swallowing, difficulty breathing.  No new products, medications, foods.  No known exposures.  Patient has been treating the rash with hydrocortisone cream without relief.  The history is provided by the patient and medical records.    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   Essential (primary) hypertension 04/21/2015   Transient alteration of awareness 12/15/2014   Chest pain 05/11/2014   Anxiety 02/27/2014   Depression 02/27/2014   Involuntary movements 02/27/2014   Panic attack 02/27/2014   Syncope and collapse 02/27/2014   Multiple thyroid  nodules 10/08/2012   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    Past Surgical History:  Procedure Laterality Date    TONSILLECTOMY     WISDOM TOOTH EXTRACTION Bilateral     OB History     Gravida  0   Para  0   Term  0   Preterm  0   AB  0   Living  0      SAB  0   IAB  0   Ectopic  0   Multiple  0   Live Births               Home Medications    Prior to Admission medications   Medication Sig Start Date End Date Taking? Authorizing Provider  cetirizine (ZYRTEC ALLERGY) 10 MG tablet Take 1 tablet (10 mg total) by mouth daily for 14 days. 03/22/24 04/05/24 Yes Corlis Burnard DEL, NP  predniSONE  (STERAPRED UNI-PAK 21 TAB) 10 MG (21) TBPK tablet Take by mouth daily. As directed 03/22/24  Yes Corlis Burnard DEL, NP  albuterol  (VENTOLIN  HFA) 108 (90 Base) MCG/ACT inhaler Inhale 1-2 puffs into the lungs every 6 (six) hours as needed for wheezing or shortness of breath. 02/08/24   Christopher Savannah, PA-C  hydrOXYzine  (VISTARIL ) 25 MG capsule Take 1 capsule (25 mg total) by mouth every 8 (eight) hours as needed. 12/21/23   Boswell, Chelsa, NP  metoprolol tartrate (LOPRESSOR) 25 MG tablet Take 1 tablet (25 mg total) by mouth once for 1 dose. 2 hours prior to  cardiac CT 03/18/24 03/18/24  Darliss Rogue, MD  pantoprazole  (PROTONIX ) 40 MG tablet Take 1 tablet (40 mg total) by mouth daily. 01/02/24   Triplett, Tammy, PA-C  tranexamic acid  (LYSTEDA ) 650 MG TABS tablet Take 2 tablets 3 times daily as needed during menses 01/18/24   Ozan, Jennifer, DO  venlafaxine  XR (EFFEXOR -XR) 37.5 MG 24 hr capsule Take 1 capsule (37.5 mg total) by mouth daily with breakfast. 12/21/23   Glennon Sand, NP    Family History Family History  Problem Relation Age of Onset   Uterine cancer Paternal Grandmother    Colon cancer Maternal Grandfather    Cancer Father    Heart disease Mother     Social History Social History   Tobacco Use   Smoking status: Former    Types: Cigarettes   Smokeless tobacco: Never  Vaping Use   Vaping status: Every Day   Substances: Nicotine, Flavoring  Substance Use Topics   Alcohol  use: Not Currently   Drug use: Yes    Types: Marijuana     Allergies   Seroquel [quetiapine fumarate] and Quetiapine   Review of Systems Review of Systems  Constitutional:  Negative for chills and fever.  HENT:  Negative for mouth sores, sore throat, trouble swallowing and voice change.   Respiratory:  Negative for cough and shortness of breath.   Gastrointestinal:  Negative for diarrhea and vomiting.  Skin:  Positive for rash. Negative for color change.     Physical Exam Triage Vital Signs ED Triage Vitals  Encounter Vitals Group     BP      Girls Systolic BP Percentile      Girls Diastolic BP Percentile      Boys Systolic BP Percentile      Boys Diastolic BP Percentile      Pulse      Resp      Temp      Temp src      SpO2      Weight      Height      Head Circumference      Peak Flow      Pain Score      Pain Loc      Pain Education      Exclude from Growth Chart    No data found.  Updated Vital Signs BP 96/71   Pulse 98   Temp 98.7 F (37.1 C)   Resp 18   LMP 03/01/2024   SpO2 99%   Visual Acuity Right Eye Distance:   Left Eye Distance:   Bilateral Distance:    Right Eye Near:   Left Eye Near:    Bilateral Near:     Physical Exam Constitutional:      General: She is not in acute distress. HENT:     Mouth/Throat:     Mouth: Mucous membranes are moist.     Pharynx: Oropharynx is clear.  Cardiovascular:     Rate and Rhythm: Normal rate.  Pulmonary:     Effort: Pulmonary effort is normal. No respiratory distress.  Skin:    General: Skin is warm and dry.     Findings: Rash present.     Comments: Scattered annular lesions with raised border on trunk and extremities.  See pictures for details.  Neurological:     Mental Status: She is alert.             UC Treatments / Results  Labs (all labs ordered are listed,  but only abnormal results are displayed) Labs Reviewed - No data to display  EKG   Radiology No results  found.  Procedures Procedures (including critical care time)  Medications Ordered in UC Medications - No data to display  Initial Impression / Assessment and Plan / UC Course  I have reviewed the triage vital signs and the nursing notes.  Pertinent labs & imaging results that were available during my care of the patient were reviewed by me and considered in my medical decision making (see chart for details).    Pruritic rash.  Afebrile and vital signs are stable.  The patient's rash started as a single lesion on her right forearm which has not gotten any larger.  The rash has spread to her trunk and extremities.  No known cause.  Patient reports no history of similar rash.  She has no other symptoms.  Treating today with prednisone  and Zyrtec.  Education provided on adult rash.  Instructed patient to follow-up with her PCP or dermatologist if she is not improving.  She agrees to plan of care.  Final Clinical Impressions(s) / UC Diagnoses   Final diagnoses:  Pruritic rash     Discharge Instructions      Take the prednisone  and Zyrtec as directed.  Follow-up with your primary care provider or dermatologist if you are not improving.     ED Prescriptions     Medication Sig Dispense Auth. Provider   predniSONE  (STERAPRED UNI-PAK 21 TAB) 10 MG (21) TBPK tablet Take by mouth daily. As directed 21 tablet Corlis Burnard DEL, NP   cetirizine (ZYRTEC ALLERGY) 10 MG tablet Take 1 tablet (10 mg total) by mouth daily for 14 days. 14 tablet Corlis Burnard DEL, NP      PDMP not reviewed this encounter.   Corlis Burnard DEL, NP 03/22/24 [phone]

## 2024-03-22 NOTE — Discharge Instructions (Addendum)
 Take the prednisone  and Zyrtec as directed.  Follow-up with your primary care provider or dermatologist if you are not improving.

## 2024-03-22 NOTE — ED Triage Notes (Signed)
 Patient to Urgent Care with complaints of itchy lesions to entire body.  No new products/ allergy exposure.  Symptoms started Monday.  Using hydrocortisone.

## 2024-04-10 ENCOUNTER — Encounter (HOSPITAL_COMMUNITY): Payer: Self-pay

## 2024-04-10 ENCOUNTER — Other Ambulatory Visit: Payer: Self-pay

## 2024-04-10 ENCOUNTER — Ambulatory Visit (HOSPITAL_COMMUNITY)

## 2024-04-10 DIAGNOSIS — M5459 Other low back pain: Secondary | ICD-10-CM | POA: Insufficient documentation

## 2024-04-10 DIAGNOSIS — M5442 Lumbago with sciatica, left side: Secondary | ICD-10-CM | POA: Diagnosis not present

## 2024-04-10 DIAGNOSIS — G8929 Other chronic pain: Secondary | ICD-10-CM | POA: Diagnosis not present

## 2024-04-10 DIAGNOSIS — Z7409 Other reduced mobility: Secondary | ICD-10-CM | POA: Diagnosis present

## 2024-04-10 DIAGNOSIS — R29898 Other symptoms and signs involving the musculoskeletal system: Secondary | ICD-10-CM | POA: Insufficient documentation
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
## 2024-04-15 ENCOUNTER — Ambulatory Visit: Admission: RE | Admit: 2024-04-15 | Source: Ambulatory Visit

## 2024-04-16 ENCOUNTER — Ambulatory Visit (HOSPITAL_COMMUNITY): Admitting: Physical Therapy

## 2024-04-16 DIAGNOSIS — Z7409 Other reduced mobility: Secondary | ICD-10-CM

## 2024-04-16 DIAGNOSIS — M5459 Other low back pain: Secondary | ICD-10-CM | POA: Diagnosis not present

## 2024-04-16 DIAGNOSIS — R29898 Other symptoms and signs involving the musculoskeletal system: Secondary | ICD-10-CM
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
## 2024-04-17 ENCOUNTER — Ambulatory Visit (HOSPITAL_COMMUNITY)

## 2024-04-19 DIAGNOSIS — R072 Precordial pain: Secondary | ICD-10-CM

## 2024-04-23 ENCOUNTER — Ambulatory Visit (INDEPENDENT_AMBULATORY_CARE_PROVIDER_SITE_OTHER): Admitting: Physical Therapy

## 2024-04-23 DIAGNOSIS — M5459 Other low back pain: Secondary | ICD-10-CM

## 2024-04-23 DIAGNOSIS — R29898 Other symptoms and signs involving the musculoskeletal system: Secondary | ICD-10-CM

## 2024-04-23 DIAGNOSIS — Z7409 Other reduced mobility: Secondary | ICD-10-CM
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
## 2024-04-24 ENCOUNTER — Encounter (HOSPITAL_COMMUNITY): Payer: Self-pay

## 2024-04-24 ENCOUNTER — Ambulatory Visit (HOSPITAL_COMMUNITY)

## 2024-04-24 DIAGNOSIS — M5459 Other low back pain: Secondary | ICD-10-CM

## 2024-04-24 DIAGNOSIS — R29898 Other symptoms and signs involving the musculoskeletal system: Secondary | ICD-10-CM

## 2024-04-24 DIAGNOSIS — Z7409 Other reduced mobility: Secondary | ICD-10-CM
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
## 2024-04-26 ENCOUNTER — Other Ambulatory Visit

## 2024-05-01 ENCOUNTER — Ambulatory Visit: Admitting: Orthopedic Surgery

## 2024-05-01 DIAGNOSIS — M5416 Radiculopathy, lumbar region: Secondary | ICD-10-CM

## 2024-05-01 MED ORDER — TIZANIDINE HCL 4 MG PO TABS: 4.0000 mg | ORAL_TABLET | Freq: Four times a day (QID) | ORAL | 0 refills | Status: AC | PRN

## 2024-05-01 NOTE — Progress Notes (Signed)
 Orthopedic Spine Surgery Office Note   Assessment: Patient is a 28 y.o. female with low back pain with intermittent radiation into the left lateral thigh     Plan: -Patient has tried PT, tylenol  -Patient has now had pain for over six weeks in spite of conservative treatment consisting of PT and tylenol , so recommended a MRI of the lumbar spine to evaluate for radiculopathy -Prescribed tizanidine  to try to help her with the feeling of stiffness -Patient would need to be nicotine free prior to any elective spine surgery -Patient should return to office in 5-6 weeks, x-rays at next visit: none     Patient expressed understanding of the plan and all questions were answered to the patient's satisfaction.    ___________________________________________________________________________     History:   Patient is a 28 y.o. female who presents today for follow up on her lumbar spine. Patient continues to have low back and left lateral thigh pain. Does not radiate past the knee. No right sided symptoms. She has noticed minor improvement since doing the PT but has not noticed any significant improvement. She feels tightness in her back. Having difficulty with standing for greater than 10 minutes due to pain and stiffness.    Treatments tried: PT, tylenol      Physical Exam:   General: no acute distress, appears stated age Neurologic: alert, answering questions appropriately, following commands Respiratory: unlabored breathing on room air, symmetric chest rise Psychiatric: appropriate affect, normal cadence to speech     MSK (spine):   -Strength exam                                                   Left                  Right EHL                              5/5                  5/5 TA                                 5/5                  5/5 GSC                             5/5                  5/5 Knee extension            5/5                  5/5 Hip flexion                    5/5                   5/5   -Sensory exam                           Sensation intact to light touch in L3-S1 nerve distributions of bilateral lower extremities  Imaging: XRs  of the lumbar spine from 03/06/2024 were previously independently reviewed and interpreted, showing disc height loss at L5/S1.  There appears to be a lucency in the disc space seen best on the extension view that may represent vacuum disc phenomenon.  No other degenerative changes seen in the lumbar spine.  Schmorl's nodes are seen at L3-4, L2-3, L1-2.  No fracture or dislocation seen.  No evidence of instability on flexion/extension views.     Patient name: Alexandra Solis Patient MRN: [id number] Date of visit: 05/01/2024

## 2024-05-05 ENCOUNTER — Ambulatory Visit (HOSPITAL_COMMUNITY)

## 2024-05-06 ENCOUNTER — Other Ambulatory Visit

## 2024-05-06 ENCOUNTER — Ambulatory Visit

## 2024-05-08 ENCOUNTER — Encounter

## 2024-05-08 ENCOUNTER — Ambulatory Visit (HOSPITAL_COMMUNITY)

## 2024-05-13 ENCOUNTER — Ambulatory Visit: Attending: Cardiology

## 2024-05-13 DIAGNOSIS — R072 Precordial pain: Secondary | ICD-10-CM | POA: Insufficient documentation

## 2024-05-13 LAB — ECHOCARDIOGRAM COMPLETE
AR max vel: 3.15 cm2
AV Area VTI: 3.21 cm2
AV Area mean vel: 3.15 cm2
AV Mean grad: 3 mmHg
AV Peak grad: 4.8 mmHg
Ao pk vel: 1.1 m/s
Area-P 1/2: 3.37 cm2
S' Lateral: 3.57 cm

## 2024-05-14 ENCOUNTER — Encounter (HOSPITAL_COMMUNITY): Payer: Self-pay

## 2024-05-14 ENCOUNTER — Ambulatory Visit

## 2024-05-14 ENCOUNTER — Ambulatory Visit (HOSPITAL_COMMUNITY): Attending: Orthopedic Surgery

## 2024-05-14 DIAGNOSIS — M5459 Other low back pain: Secondary | ICD-10-CM | POA: Diagnosis present

## 2024-05-14 DIAGNOSIS — I10 Essential (primary) hypertension: Secondary | ICD-10-CM

## 2024-05-14 DIAGNOSIS — Z7409 Other reduced mobility: Secondary | ICD-10-CM | POA: Insufficient documentation

## 2024-05-14 DIAGNOSIS — R29898 Other symptoms and signs involving the musculoskeletal system: Secondary | ICD-10-CM | POA: Diagnosis present

## 2024-05-14 DIAGNOSIS — R072 Precordial pain: Secondary | ICD-10-CM
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
## 2024-05-20 ENCOUNTER — Ambulatory Visit: Payer: Self-pay | Admitting: Cardiology

## 2024-05-21 ENCOUNTER — Ambulatory Visit (HOSPITAL_COMMUNITY)

## 2024-05-21 ENCOUNTER — Telehealth (HOSPITAL_COMMUNITY): Payer: Self-pay

## 2024-05-21 NOTE — Telephone Encounter (Signed)
 Pt was called concerning her missed appointment this morning. Mailbox is full and was not able to leave a message at this time. This is pts first no show.   Lang Ada, PT, DPT [facility] Office: [phone] 9:36 AM, 05/21/2024

## 2024-05-25 ENCOUNTER — Ambulatory Visit
Admission: RE | Admit: 2024-05-25 | Discharge: 2024-05-25 | Disposition: A | Discharge status: Home / Self Care | Source: Ambulatory Visit | Attending: Nurse Practitioner

## 2024-05-25 VITALS — BP 153/93 | HR 83 | Temp 98.4°F | Resp 18

## 2024-05-25 DIAGNOSIS — J101 Influenza due to other identified influenza virus with other respiratory manifestations: Secondary | ICD-10-CM | POA: Diagnosis not present

## 2024-05-25 LAB — POC COVID19/FLU A&B COMBO
Covid Antigen, POC: NEGATIVE
Influenza A Antigen, POC: POSITIVE — AB
Influenza B Antigen, POC: NEGATIVE

## 2024-05-25 MED ORDER — OSELTAMIVIR PHOSPHATE 75 MG PO CAPS: 75.0000 mg | ORAL_CAPSULE | Freq: Two times a day (BID) | ORAL | 0 refills | Status: DC

## 2024-05-25 MED ORDER — PROMETHAZINE-DM 6.25-15 MG/5ML PO SYRP: 5.0000 mL | ORAL_SOLUTION | Freq: Four times a day (QID) | ORAL | 0 refills | Status: DC | PRN

## 2024-05-25 NOTE — Discharge Instructions (Addendum)
 The COVID/flu test was positive for influenza A. Take medication as prescribed. Increase fluids and allow for plenty of rest. You may take over-the-counter Tylenol  or ibuprofen  as needed for pain, fever, or general discomfort. For your cough, recommend the use of a humidifier in your bedroom at nighttime during sleep and to sleep elevated on pillows while symptoms persist. If you develop fever, you should remain home until you have been fever free for 24 hours with no medication. Symptoms should begin to improve over the next 5 to 7 days.  If symptoms fail to improve, or begin to worsen, you may follow-up in this clinic or with your primary care physician for further evaluation. Follow-up as needed.

## 2024-05-25 NOTE — ED Triage Notes (Signed)
 Exposed to flu.  Fatigue, body aches, cough since yesterday.

## 2024-05-25 NOTE — ED Provider Notes (Signed)
 " [facility]    CSN: [id number] Arrival date & time: 05/25/24  1238      History   Chief Complaint Chief Complaint  Patient presents with   Cough    Exposed to flu A every day this week. Have started having muscle aches and extreme fatigue today. I have already had a cough that I have kept since September with infection taste and wheezing but have noticed that it has picked up the last few days. - Entered by patient    HPI BRAXTYN DORFF is a 29 y.o. female.   The history is provided by the patient.   Patient presents for a 1 day history of fatigue, body aches, sore throat, and cough.  Patient reports that she has been exposed to influenza A all week around her stepchildren.  States that she has not had fever, chills, headache, ear pain, ear drainage, wheezing, difficulty breathing, abdominal pain, nausea, vomiting, diarrhea, or rash.  States that she has been taking Mucinex for her symptoms.  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   Essential (primary) hypertension 04/21/2015   Transient alteration of awareness 12/15/2014   Chest pain 05/11/2014   Anxiety 02/27/2014   Depression 02/27/2014   Involuntary movements 02/27/2014   Panic attack 02/27/2014   Syncope and collapse 02/27/2014   Multiple thyroid  nodules 10/08/2012   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    Past Surgical History:  Procedure Laterality Date   TONSILLECTOMY     WISDOM TOOTH EXTRACTION Bilateral     OB History      Gravida  0   Para  0   Term  0   Preterm  0   AB  0   Living  0      SAB  0   IAB  0   Ectopic  0   Multiple  0   Live Births               Home Medications    Prior to Admission medications  Medication Sig Start Date End Date Taking? Authorizing Provider  albuterol  (VENTOLIN  HFA) 108 (90 Base) MCG/ACT inhaler Inhale 1-2 puffs into the lungs every 6 (six) hours as needed for wheezing or shortness of breath. 02/08/24   Christopher Savannah, PA-C  oseltamivir  (TAMIFLU ) 75 MG capsule Take 1 capsule (75 mg total) by mouth every 12 (twelve) hours. 05/25/24  Yes Leath-Warren, Etta PARAS, NP  pantoprazole  (PROTONIX ) 40 MG tablet Take 1 tablet (40 mg total) by mouth daily. 01/02/24   Triplett, Tammy, PA-C  promethazine -dextromethorphan (PROMETHAZINE -DM) 6.25-15 MG/5ML syrup Take 5 mLs by mouth 4 (four) times daily as needed. 05/25/24  Yes Leath-Warren, Etta PARAS, NP  tiZANidine  (ZANAFLEX ) 4 MG tablet Take 1 tablet (4 mg total) by mouth every 6 (six) hours as needed (muscle spasms, pain). 05/01/24   Georgina Ozell LABOR, MD  tranexamic acid  (LYSTEDA ) 650 MG TABS tablet Take 2 tablets 3 times daily  as needed during menses 01/18/24   Ozan, Jennifer, DO    Family History Family History  Problem Relation Age of Onset   Uterine cancer Paternal Grandmother    Colon cancer Maternal Grandfather    Cancer Father    Heart disease Mother     Social History Social History[1]   Allergies   Seroquel [quetiapine fumarate] and Quetiapine   Review of Systems Review of Systems Per HPI  Physical Exam Triage Vital Signs ED Triage Vitals  Encounter Vitals Group     BP 05/25/24 1256 (!) 153/93     Girls Systolic BP Percentile --      Girls Diastolic BP Percentile --      Boys Systolic BP Percentile --      Boys Diastolic BP Percentile --      Pulse Rate 05/25/24 1256 83     Resp 05/25/24 1256 18     Temp 05/25/24 1256 98.4 F (36.9 C)     Temp Source 05/25/24 1256 Oral     SpO2 05/25/24  1256 96 %     Weight --      Height --      Head Circumference --      Peak Flow --      Pain Score 05/25/24 1257 4     Pain Loc --      Pain Education --      Exclude from Growth Chart --    No data found.  Updated Vital Signs BP (!) 153/93 (BP Location: Right Arm)   Pulse 83   Temp 98.4 F (36.9 C) (Oral)   Resp 18   LMP 04/29/2024 (Approximate)   SpO2 96%   Visual Acuity Right Eye Distance:   Left Eye Distance:   Bilateral Distance:    Right Eye Near:   Left Eye Near:    Bilateral Near:     Physical Exam Vitals and nursing note reviewed.  Constitutional:      General: She is not in acute distress.    Appearance: Normal appearance.  HENT:     Head: Normocephalic.     Right Ear: Tympanic membrane, ear canal and external ear normal.     Left Ear: Tympanic membrane, ear canal and external ear normal.     Nose: Nose normal.     Right Turbinates: Enlarged and swollen.     Left Turbinates: Enlarged and swollen.     Right Sinus: No maxillary sinus tenderness or frontal sinus tenderness.     Left Sinus: No maxillary sinus tenderness or frontal sinus tenderness.     Mouth/Throat:     Lips: Pink.     Mouth: Mucous membranes are moist.     Pharynx: Posterior oropharyngeal erythema and postnasal drip present. No pharyngeal swelling, oropharyngeal exudate or uvula swelling.     Comments: Cobblestoning present to posterior oropharynx  Eyes:     Extraocular Movements: Extraocular movements intact.     Conjunctiva/sclera: Conjunctivae normal.     Pupils: Pupils are equal, round, and reactive to light.  Cardiovascular:     Rate and Rhythm: Normal rate and regular rhythm.     Pulses: Normal pulses.     Heart sounds: Normal heart sounds.  Pulmonary:     Effort: Pulmonary effort is normal. No respiratory distress.     Breath sounds: Normal breath sounds. No stridor. No wheezing, rhonchi or rales.  Abdominal:     General: Bowel sounds are normal.     Palpations: Abdomen  is  soft.  Musculoskeletal:     Cervical back: Normal range of motion.  Skin:    General: Skin is warm and dry.  Neurological:     General: No focal deficit present.     Mental Status: She is alert and oriented to person, place, and time.  Psychiatric:        Mood and Affect: Mood normal.        Behavior: Behavior normal.      UC Treatments / Results  Labs (all labs ordered are listed, but only abnormal results are displayed) Labs Reviewed  POC COVID19/FLU A&B COMBO - Abnormal; Notable for the following components:      Result Value   Influenza A Antigen, POC Positive (*)    All other components within normal limits    EKG   Radiology No results found.  Procedures Procedures (including critical care time)  Medications Ordered in UC Medications - No data to display  Initial Impression / Assessment and Plan / UC Course  I have reviewed the triage vital signs and the nursing notes.  Pertinent labs & imaging results that were available during my care of the patient were reviewed by me and considered in my medical decision making (see chart for details).  On exam, the patient is well-appearing, she is in no acute distress, vital signs are stable.  COVID/flu test was positive for influenza A.  Treat with Tamiflu  75 mg twice daily and Promethazine  DM for the cough.  Supportive care recommendations were provided and discussed with the patient to include fluids, rest, over-the-counter analgesics, and use of a humidifier during sleep.  Discussed indications with the patient regarding follow-up.  Patient was in agreement with this plan of care and verbalizes understanding.  All questions were answered.  Patient stable for discharge. Work note was provided.   Final Clinical Impressions(s) / UC Diagnoses   Final diagnoses:  Influenza A     Discharge Instructions      The COVID/flu test was positive for influenza A. Take medication as prescribed. Increase fluids and allow for  plenty of rest. You may take over-the-counter Tylenol  or ibuprofen  as needed for pain, fever, or general discomfort. For your cough, recommend the use of a humidifier in your bedroom at nighttime during sleep and to sleep elevated on pillows while symptoms persist. If you develop fever, you should remain home until you have been fever free for 24 hours with no medication. Symptoms should begin to improve over the next 5 to 7 days.  If symptoms fail to improve, or begin to worsen, you may follow-up in this clinic or with your primary care physician for further evaluation. Follow-up as needed.     ED Prescriptions     Medication Sig Dispense Auth. Provider   oseltamivir  (TAMIFLU ) 75 MG capsule Take 1 capsule (75 mg total) by mouth every 12 (twelve) hours. 10 capsule Leath-Warren, Etta PARAS, NP   promethazine -dextromethorphan (PROMETHAZINE -DM) 6.25-15 MG/5ML syrup Take 5 mLs by mouth 4 (four) times daily as needed. 118 mL Leath-Warren, Etta PARAS, NP      PDMP not reviewed this encounter.    [1]  Social History Tobacco Use   Smoking status: Former    Types: Cigarettes   Smokeless tobacco: Never  Vaping Use   Vaping status: Every Day   Substances: Nicotine, Flavoring  Substance Use Topics   Alcohol use: Not Currently   Drug use: Yes    Types: Marijuana     Leath-Warren, Etta  J, NP 05/25/24 1328  "

## 2024-05-26 ENCOUNTER — Telehealth: Payer: Self-pay | Admitting: Emergency Medicine

## 2024-05-26 MED ORDER — PROMETHAZINE-DM 6.25-15 MG/5ML PO SYRP: 5.0000 mL | ORAL_SOLUTION | Freq: Four times a day (QID) | ORAL | 0 refills | Status: AC | PRN

## 2024-05-26 MED ORDER — OSELTAMIVIR PHOSPHATE 75 MG PO CAPS: 75.0000 mg | ORAL_CAPSULE | Freq: Two times a day (BID) | ORAL | 0 refills | Status: DC

## 2024-05-26 NOTE — Telephone Encounter (Signed)
 Patient called and states her pharmacy is closed and requested Rx for tamiflu  and promethazine  DM be sent to [facility] pharmacy

## 2024-05-28 ENCOUNTER — Ambulatory Visit (HOSPITAL_COMMUNITY)

## 2024-05-29 ENCOUNTER — Ambulatory Visit (HOSPITAL_COMMUNITY)

## 2024-05-31 ENCOUNTER — Telehealth: Admitting: Nurse Practitioner

## 2024-05-31 DIAGNOSIS — J019 Acute sinusitis, unspecified: Secondary | ICD-10-CM

## 2024-05-31 DIAGNOSIS — B9689 Other specified bacterial agents as the cause of diseases classified elsewhere: Secondary | ICD-10-CM | POA: Diagnosis not present

## 2024-06-01 ENCOUNTER — Telehealth: Admitting: Nurse Practitioner

## 2024-06-01 DIAGNOSIS — B9689 Other specified bacterial agents as the cause of diseases classified elsewhere: Secondary | ICD-10-CM

## 2024-06-01 MED ORDER — AMOXICILLIN-POT CLAVULANATE 875-125 MG PO TABS: 1.0000 | ORAL_TABLET | Freq: Two times a day (BID) | ORAL | 0 refills | Status: AC

## 2024-06-01 MED ORDER — LIDOCAINE VISCOUS HCL 2 % MT SOLN: 15.0000 mL | OROMUCOSAL | 0 refills | Status: AC | PRN

## 2024-06-01 MED ORDER — IPRATROPIUM BROMIDE 0.03 % NA SOLN: 2.0000 | Freq: Two times a day (BID) | NASAL | 0 refills | Status: AC

## 2024-06-01 NOTE — Progress Notes (Signed)
DUPlicate

## 2024-06-01 NOTE — Progress Notes (Signed)
"      E-Visit for Sinus Problems  We are sorry that you are not feeling well.  Here is how we plan to help!  Based on what you have shared with me it looks like you have sinusitis.  Sinusitis is inflammation and infection in the sinus cavities of the head.  Based on your presentation I believe you most likely have Acute Bacterial Sinusitis.  This is an infection caused by bacteria and is treated with antibiotics. I have prescribed Augmentin  875mg /125mg  one tablet twice daily with food, for 7 days. and I have also prescribed Ipratropium Bromide  Nasal Spray Use 1 spray in each nostril twice daily as needed for drainage; discontinue if too drying. Viscous lidocaine  has been seen for your sore throat. I also recommend warm salt water gargles.   You may use an oral decongestant such as Mucinex D or if you have glaucoma or high blood pressure use plain Mucinex. Saline nasal spray help and can safely be used as often as needed for congestion.  If you develop worsening sinus pain, fever or notice severe headache and vision changes, or if symptoms are not better after completion of antibiotic, please schedule an appointment with a health care provider.    Sinus infections are not as easily transmitted as other respiratory infection, however we still recommend that you avoid close contact with loved ones, especially the very young and elderly.  Remember to wash your hands thoroughly throughout the day as this is the number one way to prevent the spread of infection!  Home Care: Only take medications as instructed by your medical team. Complete the entire course of an antibiotic. Do not take these medications with alcohol. A steam or ultrasonic humidifier can help congestion.  You can place a towel over your head and breathe in the steam from hot water coming from a faucet. Avoid close contacts especially the very young and the elderly. Cover your mouth when you cough or sneeze. Always remember to wash your  hands.  Get Help Right Away If: You develop worsening fever or sinus pain. You develop a severe head ache or visual changes. Your symptoms persist after you have completed your treatment plan.  Make sure you Understand these instructions. Will watch your condition. Will get help right away if you are not doing well or get worse.  Your e-visit answers were reviewed by a board certified advanced clinical practitioner to complete your personal care plan.  Depending on the condition, your plan could have included both over the counter or prescription medications.  If there is a problem please reply  once you have received a response from your provider. 5 Your safety is important to us .  If you have drug allergies check your prescription carefully.    You can use MyChart to ask questions about todays visit, request a non-urgent call back, or ask for a work or school excuse for 24 hours related to this e-Visit. If it has been greater than 24 hours you will need to follow up with your provider, or enter a new e-Visit to address those concerns.  You will get an e-mail in the next two days asking about your experience.  I hope that your e-visit has been valuable and will speed your recovery. Thank you for using e-visits.  I have spent 5 minutes in review of e-visit questionnaire, review and updating patient chart, medical decision making and response to patient.   Haze LELON Servant, NP     "

## 2024-06-04 ENCOUNTER — Encounter: Admitting: Family Medicine

## 2024-06-07 ENCOUNTER — Ambulatory Visit (INDEPENDENT_AMBULATORY_CARE_PROVIDER_SITE_OTHER)

## 2024-06-07 ENCOUNTER — Ambulatory Visit
Admission: RE | Admit: 2024-06-07 | Discharge: 2024-06-07 | Disposition: A | Discharge status: Home / Self Care | Source: Ambulatory Visit | Attending: Physician Assistant | Admitting: Physician Assistant

## 2024-06-07 ENCOUNTER — Ambulatory Visit: Payer: Self-pay | Admitting: Physician Assistant

## 2024-06-07 ENCOUNTER — Ambulatory Visit (HOSPITAL_COMMUNITY): Admission: RE | Admit: 2024-06-07

## 2024-06-07 VITALS — BP 152/90 | HR 82 | Temp 98.4°F | Resp 20

## 2024-06-07 DIAGNOSIS — J40 Bronchitis, not specified as acute or chronic: Secondary | ICD-10-CM | POA: Diagnosis not present

## 2024-06-07 DIAGNOSIS — R051 Acute cough: Secondary | ICD-10-CM | POA: Diagnosis not present

## 2024-06-07 DIAGNOSIS — J329 Chronic sinusitis, unspecified: Secondary | ICD-10-CM

## 2024-06-07 DIAGNOSIS — J4521 Mild intermittent asthma with (acute) exacerbation: Secondary | ICD-10-CM | POA: Diagnosis not present

## 2024-06-07 MED ORDER — DOXYCYCLINE HYCLATE 100 MG PO CAPS: 100.0000 mg | ORAL_CAPSULE | Freq: Two times a day (BID) | ORAL | 0 refills | Status: DC

## 2024-06-07 MED ORDER — ALBUTEROL SULFATE HFA 108 (90 BASE) MCG/ACT IN AERS
2.0000 | INHALATION_SPRAY | Freq: Once | RESPIRATORY_TRACT | Status: AC
  Administered 2024-06-07: 2 via RESPIRATORY_TRACT

## 2024-06-07 MED ORDER — PREDNISONE 20 MG PO TABS: 40.0000 mg | ORAL_TABLET | Freq: Every day | ORAL | 0 refills | Status: DC

## 2024-06-07 NOTE — Discharge Instructions (Signed)
 I did not see any evidence of pneumonia on your x-ray.  I would like to change her antibiotic since you have not responded to the Augmentin  that was prescribed.  Stop the Augmentin  and start doxycycline  100 mg twice daily.  Stay out of the sun while on this medication.  I am also starting prednisone  to help with inflammation.  Do not take prednisone  with NSAIDs (aspirin, ibuprofen /Advil , naproxen /Aleve ).  You can use Tylenol /acetaminophen  for additional symptom relief.  Use the albuterol  every 4-6 hours as needed. If you are not feeling better within 3 to 5 days of starting this medication or if anything worsens and you have high fever, worsening cough, shortness of breath, chest pain you need to be seen immediately.

## 2024-06-07 NOTE — ED Triage Notes (Signed)
 Pt reports she has facial pain, right ear pain, cough, and wheezing x 2 weeks

## 2024-06-07 NOTE — ED Provider Notes (Signed)
 " [facility]    CSN: [id number] Arrival date & time: 06/07/24  1729      History   Chief Complaint Chief Complaint  Patient presents with   Ear Fullness    Diagnosed with flu A on the 17th have since been prescribed antibiotics that didnt help for diagnosis of sinusitis and only helped with cough. Now having pain in right ear and still having facial pain and nasal congestion that I cant get rid of. - Entered by patient    HPI Alexandra Solis is a 29 y.o. female.   Patient presents today with a 2-week history of persistent URI symptoms.  She reports that on 05/25/2024 she was diagnosed with influenza A.  She continued to have significant symptoms and so completed an e-visit on 05/31/2024 at which point she was diagnosed with bacterial sinusitis and started with Augmentin  twice daily for 7 days.  She reports compliance with this medication but continues to have significant sinus pressure, nasal congestion, right otalgia, cough, shortness of breath, wheezing.  She denies formal diagnosis of chronic lung condition including asthma or COPD but has used albuterol  in the past when she was sick.  She does not currently have this medication available to try it.  She has been using multiple over-the-counter medication including Tylenol , ibuprofen , DayQuil/NyQuil without improvement of symptoms.  She has no concern for pregnancy.  She is concerned because her symptoms persist despite multiple prescription and over-the-counter treatments.    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   Essential (primary) hypertension 04/21/2015   Transient alteration of awareness 12/15/2014   Chest pain 05/11/2014   Anxiety 02/27/2014   Depression 02/27/2014   Involuntary movements 02/27/2014   Panic attack 02/27/2014   Syncope and collapse 02/27/2014   Multiple thyroid  nodules 10/08/2012   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 Authorizing Provider  doxycycline  (VIBRAMYCIN ) 100 MG capsule Take 1 capsule (100 mg total) by mouth 2 (two) times daily. 06/07/24  Yes Divon Krabill K, PA-C  predniSONE  (DELTASONE ) 20 MG tablet Take 2 tablets (40 mg total) by mouth daily for 5 days. 06/07/24 06/12/24 Yes Aquiles Ruffini K, PA-C  albuterol  (VENTOLIN  HFA) 108 (90 Base) MCG/ACT inhaler Inhale 1-2 puffs into the lungs every 6 (six) hours as  needed for wheezing or shortness of breath. 02/08/24   Christopher Savannah, PA-C  amoxicillin -clavulanate (AUGMENTIN ) 875-125 MG tablet Take 1 tablet by mouth 2 (two) times daily for 7 days. 06/01/24 06/08/24  Fleming, Zelda W, NP  ipratropium (ATROVENT ) 0.03 % nasal spray Place 2 sprays into both nostrils every 12 (twelve) hours. 06/01/24   Fleming, Zelda W, NP  lidocaine  (XYLOCAINE ) 2 % solution Use as directed 15 mLs in the mouth or throat as needed. 06/01/24   Fleming, Zelda W, NP  pantoprazole  (PROTONIX ) 40 MG tablet Take 1 tablet (40 mg total) by mouth daily. 01/02/24   Triplett, Tammy, PA-C  promethazine -dextromethorphan (PROMETHAZINE -DM) 6.25-15 MG/5ML syrup Take 5 mLs by mouth 4 (four) times daily as needed. 05/26/24    Leath-Warren, Etta PARAS, NP  tiZANidine  (ZANAFLEX ) 4 MG tablet Take 1 tablet (4 mg total) by mouth every 6 (six) hours as needed (muscle spasms, pain). 05/01/24   Georgina Ozell LABOR, MD  tranexamic acid  (LYSTEDA ) 650 MG TABS tablet Take 2 tablets 3 times daily as needed during menses 01/18/24   Ozan, Jennifer, DO    Family History Family History  Problem Relation Age of Onset   Uterine cancer Paternal Grandmother    Colon cancer Maternal Grandfather    Cancer Father    Heart disease Mother     Social History Social History[1]   Allergies   Seroquel [quetiapine fumarate] and Quetiapine   Review of Systems Review of Systems  Constitutional:  Positive for activity change and fatigue. Negative for appetite change and fever (resolved.).  HENT:  Positive for congestion, postnasal drip and sinus pressure. Negative for sneezing and sore throat.   Respiratory:  Positive for cough, shortness of breath and wheezing. Negative for chest tightness.   Cardiovascular:  Negative for chest pain.  Gastrointestinal:  Negative for abdominal pain, diarrhea, nausea and vomiting.  Neurological:  Positive for headaches (sinus). Negative for dizziness and light-headedness.     Physical Exam Triage Vital Signs ED Triage Vitals [06/07/24 1759]  Encounter Vitals Group     BP (!) 152/90     Girls Systolic BP Percentile      Girls Diastolic BP Percentile      Boys Systolic BP Percentile      Boys Diastolic BP Percentile      Pulse Rate 82     Resp 20     Temp 98.4 F (36.9 C)     Temp Source Oral     SpO2 95 %     Weight      Height      Head Circumference      Peak Flow      Pain Score 0     Pain Loc      Pain Education      Exclude from Growth Chart    No data found.  Updated Vital Signs BP (!) 152/90 (BP Location: Right Arm)   Pulse 82   Temp 98.4 F (36.9 C) (Oral)   Resp 20   LMP 05/30/2024 (Approximate)   SpO2 95%   Visual Acuity Right Eye Distance:   Left Eye Distance:    Bilateral Distance:    Right Eye Near:   Left Eye Near:    Bilateral Near:     Physical Exam Vitals reviewed.  Constitutional:      General: She is awake. She is not in acute distress.    Appearance: Normal appearance. She is well-developed. She is not ill-appearing.     Comments:  Very pleasant female appears stated age in no acute distress sitting comfortably in exam room  HENT:     Head: Normocephalic and atraumatic.     Right Ear: Ear canal and external ear normal. A middle ear effusion is present. Tympanic membrane is not erythematous or bulging.     Left Ear: Ear canal and external ear normal. A middle ear effusion is present. Tympanic membrane is not erythematous or bulging.     Nose:     Right Sinus: Maxillary sinus tenderness and frontal sinus tenderness present.     Left Sinus: Maxillary sinus tenderness and frontal sinus tenderness present.     Mouth/Throat:     Pharynx: Uvula midline. Posterior oropharyngeal erythema and postnasal drip present. No oropharyngeal exudate.  Cardiovascular:     Rate and Rhythm: Normal rate and regular rhythm.     Heart sounds: Normal heart sounds, S1 normal and S2 normal. No murmur heard. Pulmonary:     Effort: Pulmonary effort is normal.     Breath sounds: Wheezing present. No rhonchi or rales.     Comments: Scattered wheezing improved following albuterol  in clinic Psychiatric:        Behavior: Behavior is cooperative.      UC Treatments / Results  Labs (all labs ordered are listed, but only abnormal results are displayed) Labs Reviewed - No data to display  EKG   Radiology No results found.  Procedures Procedures (including critical care time)  Medications Ordered in UC Medications  albuterol  (VENTOLIN  HFA) 108 (90 Base) MCG/ACT inhaler 2 puff (2 puffs Inhalation Given 06/07/24 1828)    Initial Impression / Assessment and Plan / UC Course  I have reviewed the triage vital signs and the nursing notes.  Pertinent labs &  imaging results that were available during my care of the patient were reviewed by me and considered in my medical decision making (see chart for details).     Patient is well-appearing, afebrile, nontoxic, nontachycardic.  Viral testing was deferred as she has been symptomatic for several weeks and this would not change her management.  Chest x-ray was obtained that showed no acute cardiopulmonary disease based on my primary read.  At the time of discharge we were waiting for radiologist over read and will contact her if this differs and changes our treatment plan.  Will cover with doxycycline  as she is not responding to Augmentin .  She was given albuterol  in clinic with improvement of symptoms and sent home with this medication to be used every 4-6 hours as needed.  I suspect she has underlying asthma that was triggered by her recent upper respiratory infection.  Will treat with prednisone  burst of 40 mg for 5 days.  We discussed that she is not to take NSAIDs with this medication due to risk of GI bleeding but can use over-the-counter medicine such as Tylenol , Mucinex, Flonase, nasal saline/sinus rinses.  We discussed that if she is not feeling better within 3 to 5 days she should return for reevaluation.  If anything worsens and she has high fever despite antibiotics, worsening cough, shortness of breath, wheezing despite bronchodilator therapy, nausea/vomiting, weakness she needs to be seen emergently.  Return precautions given.  Excuse note provided.  Final Clinical Impressions(s) / UC Diagnoses   Final diagnoses:  Acute cough  Sinobronchitis  Mild intermittent asthma with acute exacerbation     Discharge Instructions      I did not see any evidence of pneumonia on your x-ray.  I  would like to change her antibiotic since you have not responded to the Augmentin  that was prescribed.  Stop the Augmentin  and start doxycycline  100 mg twice daily.  Stay out of the sun while on this medication.  I  am also starting prednisone  to help with inflammation.  Do not take prednisone  with NSAIDs (aspirin, ibuprofen /Advil , naproxen /Aleve ).  You can use Tylenol /acetaminophen  for additional symptom relief.  Use the albuterol  every 4-6 hours as needed. If you are not feeling better within 3 to 5 days of starting this medication or if anything worsens and you have high fever, worsening cough, shortness of breath, chest pain you need to be seen immediately.      ED Prescriptions     Medication Sig Dispense Auth. Provider   doxycycline  (VIBRAMYCIN ) 100 MG capsule Take 1 capsule (100 mg total) by mouth 2 (two) times daily. 14 capsule Breshay Ilg K, PA-C   predniSONE  (DELTASONE ) 20 MG tablet Take 2 tablets (40 mg total) by mouth daily for 5 days. 10 tablet Atha Mcbain K, PA-C      PDMP not reviewed this encounter.    [1]  Social History Tobacco Use   Smoking status: Former    Types: Cigarettes   Smokeless tobacco: Never  Vaping Use   Vaping status: Every Day   Substances: Nicotine, Flavoring  Substance Use Topics   Alcohol use: Not Currently   Drug use: Yes    Types: Marijuana     Sherrell Rocky POUR, PA-C 06/07/24 1906  "

## 2024-06-08 ENCOUNTER — Telehealth: Payer: Self-pay

## 2024-06-08 MED ORDER — DOXYCYCLINE HYCLATE 100 MG PO CAPS: 100.0000 mg | ORAL_CAPSULE | Freq: Two times a day (BID) | ORAL | 0 refills | Status: AC

## 2024-06-08 MED ORDER — PREDNISONE 20 MG PO TABS: 40.0000 mg | ORAL_TABLET | Freq: Every day | ORAL | 0 refills | Status: AC

## 2024-06-08 NOTE — Telephone Encounter (Signed)
 Pt called stated pharmacy closed.

## 2024-06-11 ENCOUNTER — Ambulatory Visit (HOSPITAL_COMMUNITY)

## 2024-06-12 ENCOUNTER — Ambulatory Visit: Admitting: Orthopedic Surgery

## 2024-06-17 ENCOUNTER — Ambulatory Visit: Admitting: Cardiology

## 2024-06-18 ENCOUNTER — Ambulatory Visit: Payer: Self-pay | Admitting: Podiatry

## 2024-06-28 ENCOUNTER — Ambulatory Visit: Admitting: Orthopedic Surgery

## 2024-07-25 ENCOUNTER — Ambulatory Visit: Admitting: Orthopedic Surgery
